# Patient Record
Sex: Male | Born: 1960 | Race: Black or African American | Hispanic: No | Smoking: Never smoker
Health system: Southern US, Community
[De-identification: ages and names within clinical notes are randomized; demographics above are authoritative.]

## PROBLEM LIST (undated history)

## (undated) DIAGNOSIS — J449 Chronic obstructive pulmonary disease, unspecified: Secondary | ICD-10-CM

## (undated) DIAGNOSIS — A419 Sepsis, unspecified organism: Secondary | ICD-10-CM

## (undated) DIAGNOSIS — J4489 Other specified chronic obstructive pulmonary disease: Secondary | ICD-10-CM

## (undated) DIAGNOSIS — I10 Essential (primary) hypertension: Secondary | ICD-10-CM

## (undated) DIAGNOSIS — J9621 Acute and chronic respiratory failure with hypoxia: Secondary | ICD-10-CM

## (undated) DIAGNOSIS — G4733 Obstructive sleep apnea (adult) (pediatric): Secondary | ICD-10-CM

## (undated) DIAGNOSIS — R6520 Severe sepsis without septic shock: Secondary | ICD-10-CM

## (undated) DIAGNOSIS — J869 Pyothorax without fistula: Secondary | ICD-10-CM

## (undated) DIAGNOSIS — J45909 Unspecified asthma, uncomplicated: Secondary | ICD-10-CM

## (undated) HISTORY — PX: HIP SURGERY: SHX245

## (undated) HISTORY — PX: OTHER SURGICAL HISTORY: SHX169

## (undated) HISTORY — PX: KNEE SURGERY: SHX244

---

## 2016-04-18 ENCOUNTER — Emergency Department (HOSPITAL_COMMUNITY)
Admission: EM | Admit: 2016-04-18 | Discharge: 2016-04-18 | Disposition: A | Discharge status: Home / Self Care | Payer: BLUE CROSS/BLUE SHIELD | Attending: Emergency Medicine | Admitting: Emergency Medicine

## 2016-04-18 ENCOUNTER — Encounter (HOSPITAL_COMMUNITY): Payer: Self-pay | Admitting: Neurology

## 2016-04-18 DIAGNOSIS — Y999 Unspecified external cause status: Secondary | ICD-10-CM | POA: Diagnosis not present

## 2016-04-18 DIAGNOSIS — X58XXXA Exposure to other specified factors, initial encounter: Secondary | ICD-10-CM | POA: Diagnosis not present

## 2016-04-18 DIAGNOSIS — Y929 Unspecified place or not applicable: Secondary | ICD-10-CM | POA: Insufficient documentation

## 2016-04-18 DIAGNOSIS — Y939 Activity, unspecified: Secondary | ICD-10-CM | POA: Diagnosis not present

## 2016-04-18 DIAGNOSIS — S81812A Laceration without foreign body, left lower leg, initial encounter: Secondary | ICD-10-CM | POA: Insufficient documentation

## 2016-04-18 DIAGNOSIS — J45909 Unspecified asthma, uncomplicated: Secondary | ICD-10-CM | POA: Insufficient documentation

## 2016-04-18 HISTORY — DX: Unspecified asthma, uncomplicated: J45.909

## 2016-04-18 MED ORDER — LIDOCAINE-EPINEPHRINE (PF) 2 %-1:200000 IJ SOLN
10.0000 mL | Freq: Once | INTRAMUSCULAR | Status: AC
  Administered 2016-04-18: 10 mL
  Filled 2016-04-18: qty 20

## 2016-04-18 NOTE — ED Triage Notes (Signed)
Pt reports was sitting on side of bed applying lotion when he noticed his left lower leg was bleeding and "squirting". He applied pressure and tied a sock around it. Untied sock and wound was squirting blood. Pressure applied and wrapped. Pt is a x 4. Does not remember hitting it on anything.

## 2016-04-18 NOTE — ED Provider Notes (Signed)
[facility]-EMERGENCY DEPT Provider Note   CSN: [id number] Arrival date & time: 04/18/16  1444     History   Chief Complaint Chief Complaint  Patient presents with  . Leg Injury    HPI Michael Proctor is a 55 y.o. male.  Pt presents to the ED with a bleeding area to his left lower leg.  The pt said that he was applying lotion to his leg, then noticed a spot bleeding.  It squirted blood and he applied a sock to his leg.  The wound was squirting in triage and the triage nurse applied a pressure dressing.      Past Medical History:  Diagnosis Date  . Asthma     There are no active problems to display for this patient.   Past Surgical History:  Procedure Laterality Date  . HIP SURGERY         Home Medications    Prior to Admission medications   Not on File    Family History No family history on file.  Social History Social History  Substance Use Topics  . Smoking status: Never Smoker  . Smokeless tobacco: Never Used  . Alcohol use Yes     Allergies   Shellfish allergy   Review of Systems Review of Systems  Skin: Positive for wound.  All other systems reviewed and are negative.    Physical Exam Updated Vital Signs BP 147/90 (BP Location: Left Arm)   Pulse 89   Temp 98.4 F (36.9 C) (Oral)   Resp 16   SpO2 (!) 89%   Physical Exam  Constitutional: He is oriented to person, place, and time. He appears well-developed and well-nourished.  HENT:  Head: Normocephalic and atraumatic.  Right Ear: External ear normal.  Left Ear: External ear normal.  Nose: Nose normal.  Mouth/Throat: Oropharynx is clear and moist.  Eyes: Conjunctivae and EOM are normal. Pupils are equal, round, and reactive to light.  Neck: Normal range of motion. Neck supple.  Cardiovascular: Normal rate, regular rhythm, normal heart sounds and intact distal pulses.   Pulmonary/Chest: Effort normal and breath sounds normal.  Abdominal: Soft. Bowel sounds are normal.    Musculoskeletal: Normal range of motion.  Neurological: He is alert and oriented to person, place, and time.  Skin:     Nursing note and vitals reviewed.    ED Treatments / Results  Labs (all labs ordered are listed, but only abnormal results are displayed) Labs Reviewed - No data to display  EKG  EKG Interpretation None       Radiology No results found.  Procedures .Marland Kitchen.Laceration Repair Date/Time: 04/18/2016 4:45 PM Performed by: Jacalyn LefevreHAVILAND, Haziel Molner Authorized by: Jacalyn LefevreHAVILAND, Jakory Matsuo   Consent:    Consent obtained:  Verbal   Consent given by:  Patient   Risks discussed:  Infection, pain and need for additional repair   Alternatives discussed:  No treatment Anesthesia (see MAR for exact dosages):    Anesthesia method:  Local infiltration   Local anesthetic:  Lidocaine 2% WITH epi Laceration details:    Location:  Leg   Leg location:  L lower leg   Length (cm):  0.1   Depth (mm):  0.1 Pre-procedure details:    Preparation:  Patient was prepped and draped in usual sterile fashion Exploration:    Hemostasis achieved with:  Epinephrine Treatment:    Area cleansed with:  Betadine   Amount of cleaning:  Standard   Irrigation solution:  Sterile saline Skin repair:    Repair  method:  Sutures   Suture size:  4-0   Suture material:  Nylon   Number of sutures:  5 Approximation:    Approximation:  Close Post-procedure details:    Dressing:  Antibiotic ointment and non-adherent dressing   Patient tolerance of procedure:  Tolerated well, no immediate complications Comments:     Stitches were placed on top of each other to try to staunch the bleeding.   (including critical care time)  Medications Ordered in ED Medications  lidocaine-EPINEPHrine (XYLOCAINE W/EPI) 2 %-1:200000 (PF) injection 10 mL (not administered)     Initial Impression / Assessment and Plan / ED Course  I have reviewed the triage vital signs and the nursing notes.  Pertinent labs & imaging results  that were available during my care of the patient were reviewed by me and considered in my medical decision making (see chart for details).  Clinical Course     Pt given instructions on wound care and instructed to return in 1 week for suture removal.  Final Clinical Impressions(s) / ED Diagnoses   Final diagnoses:  Laceration of left lower leg, initial encounter    New Prescriptions New Prescriptions   No medications on file     Jacalyn LefevreJulie Trishia Cuthrell, MD 04/18/16 1646

## 2016-04-25 ENCOUNTER — Emergency Department (HOSPITAL_COMMUNITY): Payer: BLUE CROSS/BLUE SHIELD

## 2016-04-25 ENCOUNTER — Encounter (HOSPITAL_COMMUNITY): Payer: Self-pay

## 2016-04-25 ENCOUNTER — Emergency Department (HOSPITAL_COMMUNITY)
Admission: EM | Admit: 2016-04-25 | Discharge: 2016-04-25 | Disposition: A | Discharge status: Home / Self Care | Payer: BLUE CROSS/BLUE SHIELD | Attending: Emergency Medicine | Admitting: Emergency Medicine

## 2016-04-25 DIAGNOSIS — Z4802 Encounter for removal of sutures: Secondary | ICD-10-CM | POA: Diagnosis not present

## 2016-04-25 DIAGNOSIS — R062 Wheezing: Secondary | ICD-10-CM | POA: Diagnosis present

## 2016-04-25 DIAGNOSIS — J45909 Unspecified asthma, uncomplicated: Secondary | ICD-10-CM | POA: Diagnosis not present

## 2016-04-25 MED ORDER — IPRATROPIUM BROMIDE 0.02 % IN SOLN
0.5000 mg | Freq: Once | RESPIRATORY_TRACT | Status: AC
  Administered 2016-04-25: 0.5 mg via RESPIRATORY_TRACT
  Filled 2016-04-25: qty 2.5

## 2016-04-25 MED ORDER — ALBUTEROL SULFATE (2.5 MG/3ML) 0.083% IN NEBU
5.0000 mg | INHALATION_SOLUTION | Freq: Once | RESPIRATORY_TRACT | Status: AC
  Administered 2016-04-25: 5 mg via RESPIRATORY_TRACT
  Filled 2016-04-25: qty 6

## 2016-04-25 NOTE — ED Provider Notes (Signed)
[facility]-EMER[facility]Y DEPT Provider Note   CSN: [id number] Arrival date & time: 04/25/16  [id number]  By signing my name below, I, Michael Proctor, attest that this documentation has been prepared under the direction and in the presence of [name], MD. Electronically Signed: Rosario AdieWilliam Andrew Proctor, ED Scribe. 04/25/16. 12:50 PM.  History   Chief Complaint Chief Complaint  Patient presents with  . Suture / Staple Removal  . Wheezing   The history is provided by the patient. No language interpreter was used.    HPI Comments: Michael Proctor is a 55 y.o. male who presents to the Emergency Department for a suture removal which was performed ~1 week ago. Pt had a wound to his left lower extremity repaired on 04/18/16 (~7 days ago) following noticing the area bleeding after getting out of the shower. At that time, his wound was repaired with 5 5-0 Nylon sutures well and w/o immediate complication. No h/o DM. He denies any active/uncontrolled bleeding, color change, or otherwise surrounding pain/rash to the area.   Pt is secondarily c/o persistent, gradually worsening wheezing over the past several days as well. Pt has a h/o asthma, and states that his current episode of wheezing feels similar to his current flare-ups. Pt has Albuterol treatments prescribed to him at home for this, which have been minimally controlling this issue. He denies shortness of breath otherwise.   Past Medical History:  Diagnosis Date  . Asthma    There are no active problems to display for this patient.  Past Surgical History:  Procedure Laterality Date  . HIP SURGERY    . KNEE SURGERY    . Tracheotomy       Home Medications    Prior to Admission medications   Not on File   Family History No family history on file.  Social History Social History  Substance Use Topics  . Smoking status: Never Smoker  . Smokeless tobacco: Never Used  . Alcohol use Yes   Allergies   Shellfish allergy  Review of  Systems Review of Systems  Respiratory: Positive for wheezing. Negative for shortness of breath.   Skin: Positive for wound. Negative for color change and rash.  All other systems reviewed and are negative.  Physical Exam Updated Vital Signs BP 144/79 (BP Location: Right Arm)   Pulse 102   Temp 98.3 F (36.8 C) (Oral)   Resp 17   Ht 5\' 7"  (1.702 m)   Wt 275 lb (124.7 kg)   SpO2 92%   BMI 43.07 kg/m   Physical Exam  Constitutional: He is oriented to person, place, and time. He appears well-developed and well-nourished. No distress.  Obese male.   HENT:  Head: Normocephalic and atraumatic.  Eyes: Conjunctivae are normal.  Neck: Normal range of motion.  Cardiovascular: Normal rate.   Pulmonary/Chest: Effort normal. He has wheezes.  Expiratory wheezes in all lung fields.   Abdominal: He exhibits no distension.  Musculoskeletal: Normal range of motion. He exhibits edema.  1+ edema in BLE. Well healed small wound to the medial portion of the left lower leg without evidence of drainage or cellulitis.   Neurological: He is alert and oriented to person, place, and time.  Skin: No pallor.  Psychiatric: He has a normal mood and affect. His behavior is normal.  Nursing note and vitals reviewed.  ED Treatments / Results  DIAGNOSTIC STUDIES: Oxygen Saturation is 92% on RA, low by my interpretation.   COORDINATION OF CARE: 12:25 PM-Discussed next steps with  pt. Pt verbalized understanding and is agreeable with the plan.   Labs (all labs ordered are listed, but only abnormal results are displayed) Labs Reviewed - No data to display  EKG  EKG Interpretation None      Radiology Dg Chest 2 View  Result Date: 04/25/2016 CLINICAL DATA:  Shortness of breath, chest pain for 2 days, history of asthma EXAM: CHEST  2 VIEW COMPARISON:  None. FINDINGS: No active infiltrate or effusion is seen. Mediastinal and hilar contours are unremarkable. The heart is within upper limits of normal.  There are degenerative changes in the shoulders and throughout the thoracic spine. IMPRESSION: No active cardiopulmonary disease. Electronically Signed   By: [name]  Barry M.D.   On: 04/25/2016 11:12   Procedures Procedures   SUTURE REMOVAL Performed by: Gwyneth SproutWhitney Triana Coover, MD Authorized by: Gwyneth SproutWhitney Tamiya Colello, MD Consent: Verbal consent obtained. Consent given by: patient Required items: required blood products, implants, devices, and special equipment available  Time out: Immediately prior to procedure a "time out" was called to verify the correct patient, procedure, equipment, support staff and site/side marked as required. Location: LLE Wound Appearance: clean, dry, intact  Sutures Removed: 5 Post-removal: pressure dressing applied Patient tolerance: Patient tolerated the procedure well with no immediate complications.  Medications Ordered in ED Medications  albuterol (PROVENTIL) (2.5 MG/3ML) 0.083% nebulizer solution 5 mg (5 mg Nebulization Given 04/25/16 1240)  ipratropium (ATROVENT) nebulizer solution 0.5 mg (0.5 mg Nebulization Given 04/25/16 1240)    Initial Impression / Assessment and Plan / ED Course  I have reviewed the triage vital signs and the nursing notes.  Pertinent labs & imaging results that were available during my care of the patient were reviewed by me and considered in my medical decision making (see chart for details).  Clinical Course    Patient presenting today for suture removal. He had sutures placed to a bleeding varicose vein. He has had no further bleeding and has no other complaints at this time however when he arrived in triage he was noted to be wheezing and had an oxygen saturation in the low 90s. He states he has a history of asthma and has been using his inhaler slightly more lately since the weather has changed but denies any new cough, fever or shortness of breath. He is wheezing diffusely on exam but appears comfortable. Patient was given one  nebulizer treatment with improvement in symptoms. Sutures were removed with some recurrent bleeding which stopped quickly when pressure was applied.  Final Clinical Impressions(s) / ED Diagnoses   Final diagnoses:  Visit for suture removal  Moderate asthma, unspecified whether complicated, unspecified whether persistent   New Prescriptions New Prescriptions   No medications on file   I personally performed the services described in this documentation, which was scribed in my presence.  The recorded information has been reviewed and considered.     Gwyneth SproutWhitney Rejoice Heatwole, MD 04/25/16 [phone]

## 2016-04-25 NOTE — ED Triage Notes (Signed)
Per Pt, Pt had to have stitches placed on left lower leg after bleeding noted after a shower. Pt was unaware of what had happened. Pt denies diabetes, but reports thin skin. Denies pain.

## 2016-04-25 NOTE — Discharge Instructions (Signed)
Leave the bandage on until tomorrow and then you can take off.  For the next few days wear a bandaid over the area so it doesn't break back open

## 2016-06-22 ENCOUNTER — Encounter (HOSPITAL_COMMUNITY): Payer: Self-pay | Admitting: Emergency Medicine

## 2016-06-22 ENCOUNTER — Emergency Department (HOSPITAL_COMMUNITY)
Admission: EM | Admit: 2016-06-22 | Discharge: 2016-06-22 | Disposition: A | Discharge status: Home / Self Care | Payer: BLUE CROSS/BLUE SHIELD | Attending: Emergency Medicine | Admitting: Emergency Medicine

## 2016-06-22 DIAGNOSIS — I10 Essential (primary) hypertension: Secondary | ICD-10-CM | POA: Diagnosis not present

## 2016-06-22 DIAGNOSIS — J45909 Unspecified asthma, uncomplicated: Secondary | ICD-10-CM | POA: Diagnosis not present

## 2016-06-22 DIAGNOSIS — T7809XA Anaphylactic reaction due to other food products, initial encounter: Secondary | ICD-10-CM | POA: Insufficient documentation

## 2016-06-22 DIAGNOSIS — T782XXA Anaphylactic shock, unspecified, initial encounter: Secondary | ICD-10-CM

## 2016-06-22 DIAGNOSIS — T7840XA Allergy, unspecified, initial encounter: Secondary | ICD-10-CM | POA: Diagnosis present

## 2016-06-22 DIAGNOSIS — Z79899 Other long term (current) drug therapy: Secondary | ICD-10-CM | POA: Insufficient documentation

## 2016-06-22 HISTORY — DX: Essential (primary) hypertension: I10

## 2016-06-22 MED ORDER — IPRATROPIUM-ALBUTEROL 0.5-2.5 (3) MG/3ML IN SOLN
3.0000 mL | RESPIRATORY_TRACT | Status: AC
  Administered 2016-06-22 (×3): 3 mL via RESPIRATORY_TRACT
  Filled 2016-06-22 (×2): qty 3

## 2016-06-22 MED ORDER — EPINEPHRINE 0.3 MG/0.3ML IJ SOAJ: 0.3000 mg | INTRAMUSCULAR | 0 refills | Status: AC | PRN

## 2016-06-22 MED ORDER — PREDNISONE 20 MG PO TABS: ORAL_TABLET | ORAL | 0 refills | Status: AC

## 2016-06-22 MED ORDER — FAMOTIDINE IN NACL 20-0.9 MG/50ML-% IV SOLN
20.0000 mg | Freq: Once | INTRAVENOUS | Status: AC
  Administered 2016-06-22: 20 mg via INTRAVENOUS
  Filled 2016-06-22: qty 50

## 2016-06-22 NOTE — ED Provider Notes (Signed)
[facility]-EMERGENCY DEPT Provider Note   CSN: [id number] Arrival date & time: 06/22/16  1650     History   Chief Complaint Chief Complaint  Patient presents with  . Allergic Reaction    HPI Michael Proctor is a 56 y.o. male.  56 yo M with a chief complaint of an allergic reaction. Patient stated that he had oysters and trimmed for lunch and then had sudden shortness of breath wheezing and felt like his throat was closing on him. EMS gave him epinephrine and Solu-Medrol Benadryl and DuoNeb. Patient had some improvement from there. States that he feels fine at this time. 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 Denies nausea or vomiting denies feeling like he'll pass out.   The history is provided by the patient and the EMS personnel.  Allergic Reaction  Presenting symptoms: difficulty breathing, difficulty swallowing, itching and wheezing   Presenting symptoms: no rash   Severity:  Severe Duration:  1 hour Prior allergic episodes:  No prior episodes Context: food   Relieved by:  Antihistamines, epinephrine and steroids Worsened by:  Nothing Ineffective treatments:  None tried   Past Medical History:  Diagnosis Date  . Asthma   . Hypertension     There are no active problems to display for this patient.   Past Surgical History:  Procedure Laterality Date  . HIP SURGERY    . KNEE SURGERY    . Tracheotomy          Home Medications    Prior to Admission medications   Medication Sig Start Date End Date Taking? Authorizing Provider  EPINEPHrine 0.3 mg/0.3 mL IJ SOAJ injection Inject 0.3 mLs (0.3 mg total) into the muscle as needed. 06/22/16   Melene Planan Maleeka Sabatino, DO  predniSONE (DELTASONE) 20 MG tablet 2 tabs po daily x 4 days 06/22/16   Melene Planan Shareeka Yim, DO    Family History History reviewed. No pertinent family history.  Social History Social History  Substance Use Topics  . Smoking status: Never Smoker  . Smokeless tobacco: Never Used  .  Alcohol use Yes     Comment: daily beer/wine     Allergies   Shellfish allergy   Review of Systems Review of Systems  Constitutional: Negative for chills and fever.  HENT: Positive for trouble swallowing. Negative for congestion and facial swelling.   Eyes: Negative for discharge and visual disturbance.  Respiratory: Positive for shortness of breath and wheezing.   Cardiovascular: Negative for chest pain and palpitations.  Gastrointestinal: Positive for nausea. Negative for abdominal pain, diarrhea and vomiting.  Musculoskeletal: Negative for arthralgias and myalgias.  Skin: Positive for itching. Negative for color change and rash.  Neurological: Negative for tremors, syncope and headaches.  Psychiatric/Behavioral: Negative for confusion and dysphoric mood.     Physical Exam Updated Vital Signs BP 154/87 (BP Location: Right Arm)   Pulse 104   Temp 98.7 F (37.1 C) (Oral)   Resp (!) 36   Ht 5\' 7"  (1.702 m)   Wt 240 lb (108.9 kg)   SpO2 96%   BMI 37.59 kg/m   Physical Exam  Constitutional: He is oriented to person, place, and time. He appears well-developed and well-nourished.  HENT:  Head: Normocephalic and atraumatic.  Eyes: EOM are normal. Pupils are equal, round, and reactive to light.  Neck: Normal range of motion. Neck supple. No JVD present.  Cardiovascular: Normal rate and regular rhythm.  Exam reveals no gallop and no friction rub.  No murmur heard. Pulmonary/Chest: No respiratory distress. He has wheezes.  Tachypnea   Abdominal: He exhibits no distension and no mass. There is no tenderness. There is no rebound and no guarding.  Musculoskeletal: Normal range of motion.  Neurological: He is alert and oriented to person, place, and time.  Skin: No rash noted. No pallor.  Psychiatric: He has a normal mood and affect. His behavior is normal.  Nursing note and vitals reviewed.    ED Treatments / Results  Labs (all labs ordered are listed, but only  abnormal results are displayed) Labs Reviewed - No data to display  EKG  EKG Interpretation None       Radiology No results found.  Procedures Procedures (including critical care time)  Medications Ordered in ED Medications  ipratropium-albuterol (DUONEB) 0.5-2.5 (3) MG/3ML nebulizer solution 3 mL (3 mLs Nebulization Given 06/22/16 1752)  famotidine (PEPCID) IVPB 20 mg premix (0 mg Intravenous Stopped 06/22/16 1925)     Initial Impression / Assessment and Plan / ED Course  I have reviewed the triage vital signs and the nursing notes.  Pertinent labs & imaging results that were available during my care of the patient were reviewed by me and considered in my medical decision making (see chart for details).     56 yo M With a chief complaint of an allergic reaction. Patient looks like he still having some difficulty breathing will give him 3 DuoNeb's back-to-back Pepcid and reassess.  Patient was reassessed multiple times while in the ED. Continuing to have some faint wheezes but looking much better. He is observed in the ED for 4 hours. Patient states that he feels well to go home. Given strict return precautions. Prescription for epinephrine.  CRITICAL CARE Performed by: Rae Roam   Total critical care time: 35 minutes  Critical care time was exclusive of separately billable procedures and treating other patients.  Critical care was necessary to treat or prevent imminent or life-threatening deterioration.  Critical care was time spent personally by me on the following activities: development of treatment plan with patient and/or surrogate as well as nursing, discussions with consultants, evaluation of patient's response to treatment, examination of patient, obtaining history from patient or surrogate, ordering and performing treatments and interventions, ordering and review of laboratory studies, ordering and review of radiographic studies, pulse oximetry and  re-evaluation of patient's condition.  9:01 PM:  I have discussed the diagnosis/risks/treatment options with the patient and believe the pt to be eligible for discharge home to follow-up with PCP. We also discussed returning to the ED immediately if new or worsening sx occur. We discussed the sx which are most concerning (e.g., sudden worsening pain, fever, inability to tolerate by mouth) that necessitate immediate return. Medications administered to the patient during their visit and any new prescriptions provided to the patient are listed below.  Medications given during this visit Medications  ipratropium-albuterol (DUONEB) 0.5-2.5 (3) MG/3ML nebulizer solution 3 mL (3 mLs Nebulization Given 06/22/16 1752)  famotidine (PEPCID) IVPB 20 mg premix (0 mg Intravenous Stopped 06/22/16 1925)     The patient appears reasonably screen and/or stabilized for discharge and I doubt any other medical condition or other [facility] requiring further screening, evaluation, or treatment in the ED at this time prior to discharge.   Final Clinical Impressions(s) / ED Diagnoses   Final diagnoses:  Anaphylaxis, initial encounter    New Prescriptions New Prescriptions   EPINEPHRINE 0.3 MG/0.3 ML IJ SOAJ INJECTION    Inject  0.3 mLs (0.3 mg total) into the muscle as needed.   PREDNISONE (DELTASONE) 20 MG TABLET    2 tabs po daily x 4 days     Melene Plan, DO 06/22/16 2101

## 2016-06-22 NOTE — ED Triage Notes (Signed)
Per EMS pt was at a [employer] and began feeling like his throat was closing/ allergic reaction. Known seafood allergy. Fire Dept administered 0.3 of EPI IM and albuterol nebulizer. Pt diaphoretic upon EMS arrival.  Pt AOx4. EMS administered Duoneb, 50 benadryl and 125mg  solumedrol. Pt reports he "feels better" wheezing noted bilaterally.

## 2016-06-22 NOTE — ED Notes (Signed)
Call Linette: [phone] to update.

## 2016-06-22 NOTE — Discharge Instructions (Signed)
Take benadryl 50 mg 3 times a day for the next two days.  Return for sudden worsening symptoms.

## 2016-06-22 NOTE — ED Notes (Signed)
Called respiratory-states they will administer breathing treatment.

## 2016-06-22 NOTE — ED Notes (Signed)
Bed: [id number] Expected date:  Expected time:  Means of arrival: Ambulance Comments: EMS allergic reaction

## 2017-04-05 ENCOUNTER — Emergency Department (HOSPITAL_COMMUNITY)
Admission: EM | Admit: 2017-04-05 | Discharge: 2017-04-05 | Disposition: A | Discharge status: Home / Self Care | Payer: BLUE CROSS/BLUE SHIELD | Attending: Emergency Medicine | Admitting: Emergency Medicine

## 2017-04-05 ENCOUNTER — Other Ambulatory Visit: Payer: Self-pay

## 2017-04-05 ENCOUNTER — Emergency Department (HOSPITAL_COMMUNITY): Payer: BLUE CROSS/BLUE SHIELD

## 2017-04-05 ENCOUNTER — Emergency Department (HOSPITAL_BASED_OUTPATIENT_CLINIC_OR_DEPARTMENT_OTHER): Payer: BLUE CROSS/BLUE SHIELD

## 2017-04-05 ENCOUNTER — Encounter (HOSPITAL_COMMUNITY): Payer: Self-pay | Admitting: *Deleted

## 2017-04-05 DIAGNOSIS — M7652 Patellar tendinitis, left knee: Secondary | ICD-10-CM | POA: Diagnosis not present

## 2017-04-05 DIAGNOSIS — M7989 Other specified soft tissue disorders: Secondary | ICD-10-CM | POA: Diagnosis not present

## 2017-04-05 DIAGNOSIS — M79609 Pain in unspecified limb: Secondary | ICD-10-CM

## 2017-04-05 DIAGNOSIS — I878 Other specified disorders of veins: Secondary | ICD-10-CM

## 2017-04-05 DIAGNOSIS — I10 Essential (primary) hypertension: Secondary | ICD-10-CM | POA: Insufficient documentation

## 2017-04-05 DIAGNOSIS — J45909 Unspecified asthma, uncomplicated: Secondary | ICD-10-CM | POA: Insufficient documentation

## 2017-04-05 DIAGNOSIS — M25562 Pain in left knee: Secondary | ICD-10-CM | POA: Diagnosis present

## 2017-04-05 DIAGNOSIS — R600 Localized edema: Secondary | ICD-10-CM | POA: Diagnosis not present

## 2017-04-05 DIAGNOSIS — R609 Edema, unspecified: Secondary | ICD-10-CM

## 2017-04-05 LAB — BASIC METABOLIC PANEL
ANION GAP: 6 (ref 5–15)
BUN: 9 mg/dL (ref 6–20)
CO2: 33 mmol/L — AB (ref 22–32)
Calcium: 8.8 mg/dL — ABNORMAL LOW (ref 8.9–10.3)
Chloride: 101 mmol/L (ref 101–111)
Creatinine, Ser: 1.21 mg/dL (ref 0.61–1.24)
GFR calc Af Amer: >60 mL/min (ref >60)
GLUCOSE: 104 mg/dL — AB (ref 65–99)
POTASSIUM: 4.3 mmol/L (ref 3.5–5.1)
Sodium: 140 mmol/L (ref 135–145)

## 2017-04-05 LAB — CBC WITH DIFFERENTIAL/PLATELET
BASOS ABS: 0 10*3/uL (ref 0.0–0.1)
Basophils Relative: 0 %
Eosinophils Absolute: 0.2 10*3/uL (ref 0.0–0.7)
Eosinophils Relative: 3 %
HEMATOCRIT: 48.9 % (ref 39.0–52.0)
HEMOGLOBIN: 16.6 g/dL (ref 13.0–17.0)
LYMPHS PCT: 32 %
Lymphs Abs: 2 10*3/uL (ref 0.7–4.0)
MCH: 31.5 pg (ref 26.0–34.0)
MCHC: 33.9 g/dL (ref 30.0–36.0)
MCV: 92.8 fL (ref 78.0–100.0)
MONOS PCT: 11 %
Monocytes Absolute: 0.7 10*3/uL (ref 0.1–1.0)
NEUTROS ABS: 3.3 10*3/uL (ref 1.7–7.7)
NEUTROS PCT: 54 %
Platelets: 200 10*3/uL (ref 150–400)
RBC: 5.27 MIL/uL (ref 4.22–5.81)
RDW: 14.7 % (ref 11.5–15.5)
WBC: 6.1 10*3/uL (ref 4.0–10.5)

## 2017-04-05 MED ORDER — OXYCODONE-ACETAMINOPHEN 5-325 MG PO TABS
1.0000 | ORAL_TABLET | Freq: Once | ORAL | Status: AC
  Administered 2017-04-05: 1 via ORAL
  Filled 2017-04-05: qty 1

## 2017-04-05 MED ORDER — NAPROXEN 500 MG PO TABS: 500.0000 mg | ORAL_TABLET | Freq: Two times a day (BID) | ORAL | 0 refills | Status: AC

## 2017-04-05 MED ORDER — TRAMADOL HCL 50 MG PO TABS: 50.0000 mg | ORAL_TABLET | Freq: Four times a day (QID) | ORAL | 0 refills | Status: AC | PRN

## 2017-04-05 MED ORDER — RIVAROXABAN 15 MG PO TABS
15.0000 mg | ORAL_TABLET | Freq: Once | ORAL | Status: AC
  Administered 2017-04-05: 15 mg via ORAL
  Filled 2017-04-05: qty 1

## 2017-04-05 NOTE — ED Notes (Signed)
Returned from Vascular Lab

## 2017-04-05 NOTE — ED Notes (Signed)
Aware he is waiting on vascular study

## 2017-04-05 NOTE — ED Notes (Signed)
Patient sleeping

## 2017-04-05 NOTE — ED Provider Notes (Signed)
[facility] Provider Note   CSN: [id number] Arrival date & time: 04/05/17  0005     History   Chief Complaint Chief Complaint  Patient presents with  . Knee Pain    HPI Michael Proctor is a 56 y.o. male.  The history is provided by the patient.  2 days ago, he woke up with pain in the anterior aspect of his left knee and swelling of his left lower leg.  He states that his left leg normally swells off and on, but current swelling is somewhat worse.  Pain is worse with ambulating.  He rates pain at 10/10.  He denies any trauma denies any unusual activity.  Nothing seems to make it better.  Of note, he states that his left leg is always more swollen than his right leg.  Past Medical History:  Diagnosis Date  . Asthma   . Hypertension     There are no active problems to display for this patient.   Past Surgical History:  Procedure Laterality Date  . HIP SURGERY    . KNEE SURGERY    . Tracheotomy          Home Medications    Prior to Admission medications   Medication Sig Start Date End Date Taking? Authorizing Provider  EPINEPHrine 0.3 mg/0.3 mL IJ SOAJ injection Inject 0.3 mLs (0.3 mg total) into the muscle as needed. 06/22/16   Melene PlanFloyd, Dan, DO  predniSONE (DELTASONE) 20 MG tablet 2 tabs po daily x 4 days 06/22/16   Melene PlanFloyd, Dan, DO    Family History No family history on file.  Social History Social History   Tobacco Use  . Smoking status: Never Smoker  . Smokeless tobacco: Never Used  Substance Use Topics  . Alcohol use: Yes    Comment: daily beer/wine  . Drug use: No     Allergies   Shellfish allergy   Review of Systems Review of Systems  All other systems reviewed and are negative.    Physical Exam Updated Vital Signs BP (!) 162/103   Pulse 89   Temp 98.8 F (37.1 C) (Oral)   Resp 18   Ht 5\' 7"  (1.702 m)   Wt 131.5 kg (290 lb)   SpO2 99%   BMI 45.42 kg/m   Physical Exam  Nursing note and vitals  reviewed.  [age] year old male, resting comfortably and in no acute distress. Vital signs are significant for hypertension. Oxygen saturation is 99%, which is normal. Head is normocephalic and atraumatic. PERRLA, EOMI. Oropharynx is clear. Neck is nontender and supple without adenopathy or JVD. Back is nontender and there is no CVA tenderness. Lungs are clear without rales, wheezes, or rhonchi. Chest is nontender. Heart has regular rate and rhythm without murmur. Abdomen is soft, flat, nontender without masses or hepatosplenomegaly and peristalsis is normoactive. Extremities: Moderate to severe bilateral venous stasis changes.  Left calf circumference is approximately 4 cm greater than right calf circumference.  There is tenderness over the left patellar tendon without any other tenderness elicited.  There is no knee effusion and there is no instability of the knee.  Pain is elicited with left knee extension against resistance, and with passive left knee flexion.  There is no overlying erythema and there is no warmth. 1+ edema of the right leg, 2+ edema of the left leg. Skin is warm and dry without rash. Neurologic: Mental status is normal, cranial nerves are intact, there are no motor or  sensory deficits.  ED Treatments / Results  Labs (all labs ordered are listed, but only abnormal results are displayed) Labs Reviewed  BASIC METABOLIC PANEL - Abnormal; Notable for the following components:      Result Value   CO2 33 (*)    Glucose, Bld 104 (*)    Calcium 8.8 (*)    All other components within normal limits  CBC WITH DIFFERENTIAL/PLATELET    Radiology Dg Knee Complete 4 Views Left  Result Date: 04/05/2017 CLINICAL DATA:  Left knee pain. EXAM: LEFT KNEE - COMPLETE 4+ VIEW COMPARISON:  None. FINDINGS: No evidence of fracture, dislocation, or joint effusion. Mild 3 compartment osteoarthritic changes with joint space narrowing, small osteophyte formation. Chondrocalcinosis versus meniscal  calcifications in the medial compartment. Soft tissues are unremarkable. 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 Chondrocalcinosis versus meniscal calcifications in the medial compartment. The presence of chondral calcifications is usually associated with CPPD arthropathy. Electronically Signed   By: Ted Mcalpineobrinka  Dimitrova M.D.   On: 04/05/2017 01:32    Procedures Procedures (including critical care time)  Medications Ordered in ED Medications  oxyCODONE-acetaminophen (PERCOCET/ROXICET) 5-325 MG per tablet 1 tablet (1 tablet Oral Given 04/05/17 0408)  Rivaroxaban (XARELTO) tablet 15 mg (15 mg Oral Given 04/05/17 0407)     Initial Impression / Assessment and Plan / ED Course  I have reviewed the triage vital signs and the nursing notes.  Pertinent labs & imaging results that were available during my care of the patient were reviewed by me and considered in my medical decision making (see chart for details).  Left knee pain which clinically, is patellar tendinitis.  However, asymmetric calf swelling is very concerning for possible DVT.  I have explained this to the patient.  He will be kept in the ED for vascular venous ultrasound.  Initial dose of rivaroxaban given in the ED.  Screening labs obtained.  He is given a dose of oxycodone-acetaminophen for pain.  Old records are reviewed, and he has no relevant past visits.  Screening labs are unremarkable.  He had reasonably good pain relief with oxycodone-acetaminophen.  Case is signed out to Dr. Fayrene FearingJames pending venous Doppler results.  Final Clinical Impressions(s) / ED Diagnoses   Final diagnoses:  Patellar tendonitis of left knee  Venous stasis of both lower extremities  Peripheral edema    ED Discharge Orders    None       Dione BoozeGlick, Anahita Cua, MD 04/05/17 [phone]

## 2017-04-05 NOTE — Discharge Instructions (Signed)
Ice to painful area. Ultram for pain Naprosyn, Anti-Inflammatory, am and pm.

## 2017-04-05 NOTE — ED Notes (Signed)
Patient resting in bed no distress at this time

## 2017-04-05 NOTE — ED Notes (Signed)
Transported to Vascular Lab

## 2017-04-05 NOTE — Progress Notes (Signed)
*  Preliminary Results* Left lower extremity venous duplex completed. Left lower extremity is negative for deep vein thrombosis. There is no evidence of left Baker's cyst.  Incidental finding: there is a heterogenous area of the left groin, suggestive of prominent inguinal lymph node.  04/05/2017 8:59 AM  Gertie FeyMichelle Sakai Heinle, BS, RVT, RDCS, RDMS

## 2017-04-05 NOTE — ED Triage Notes (Signed)
Pt reports pain in the anterior left knee and pain in the calf area since he woke up two days ago.Pt also has sig swelling to the knee and lower leg. Denies injury. Took goody powder x 2 about 1745 for the pain. Pt has HTN and is out of his meds. Denies cp or sob.

## 2017-09-24 IMAGING — CR DG CHEST 2V
2 series · 2 of 2 positions shown · non-contrast
Comparison: None.

CLINICAL DATA: Shortness of breath, chest pain for 2 days, history
of asthma

EXAM:
CHEST  2 VIEW

[chest pa]
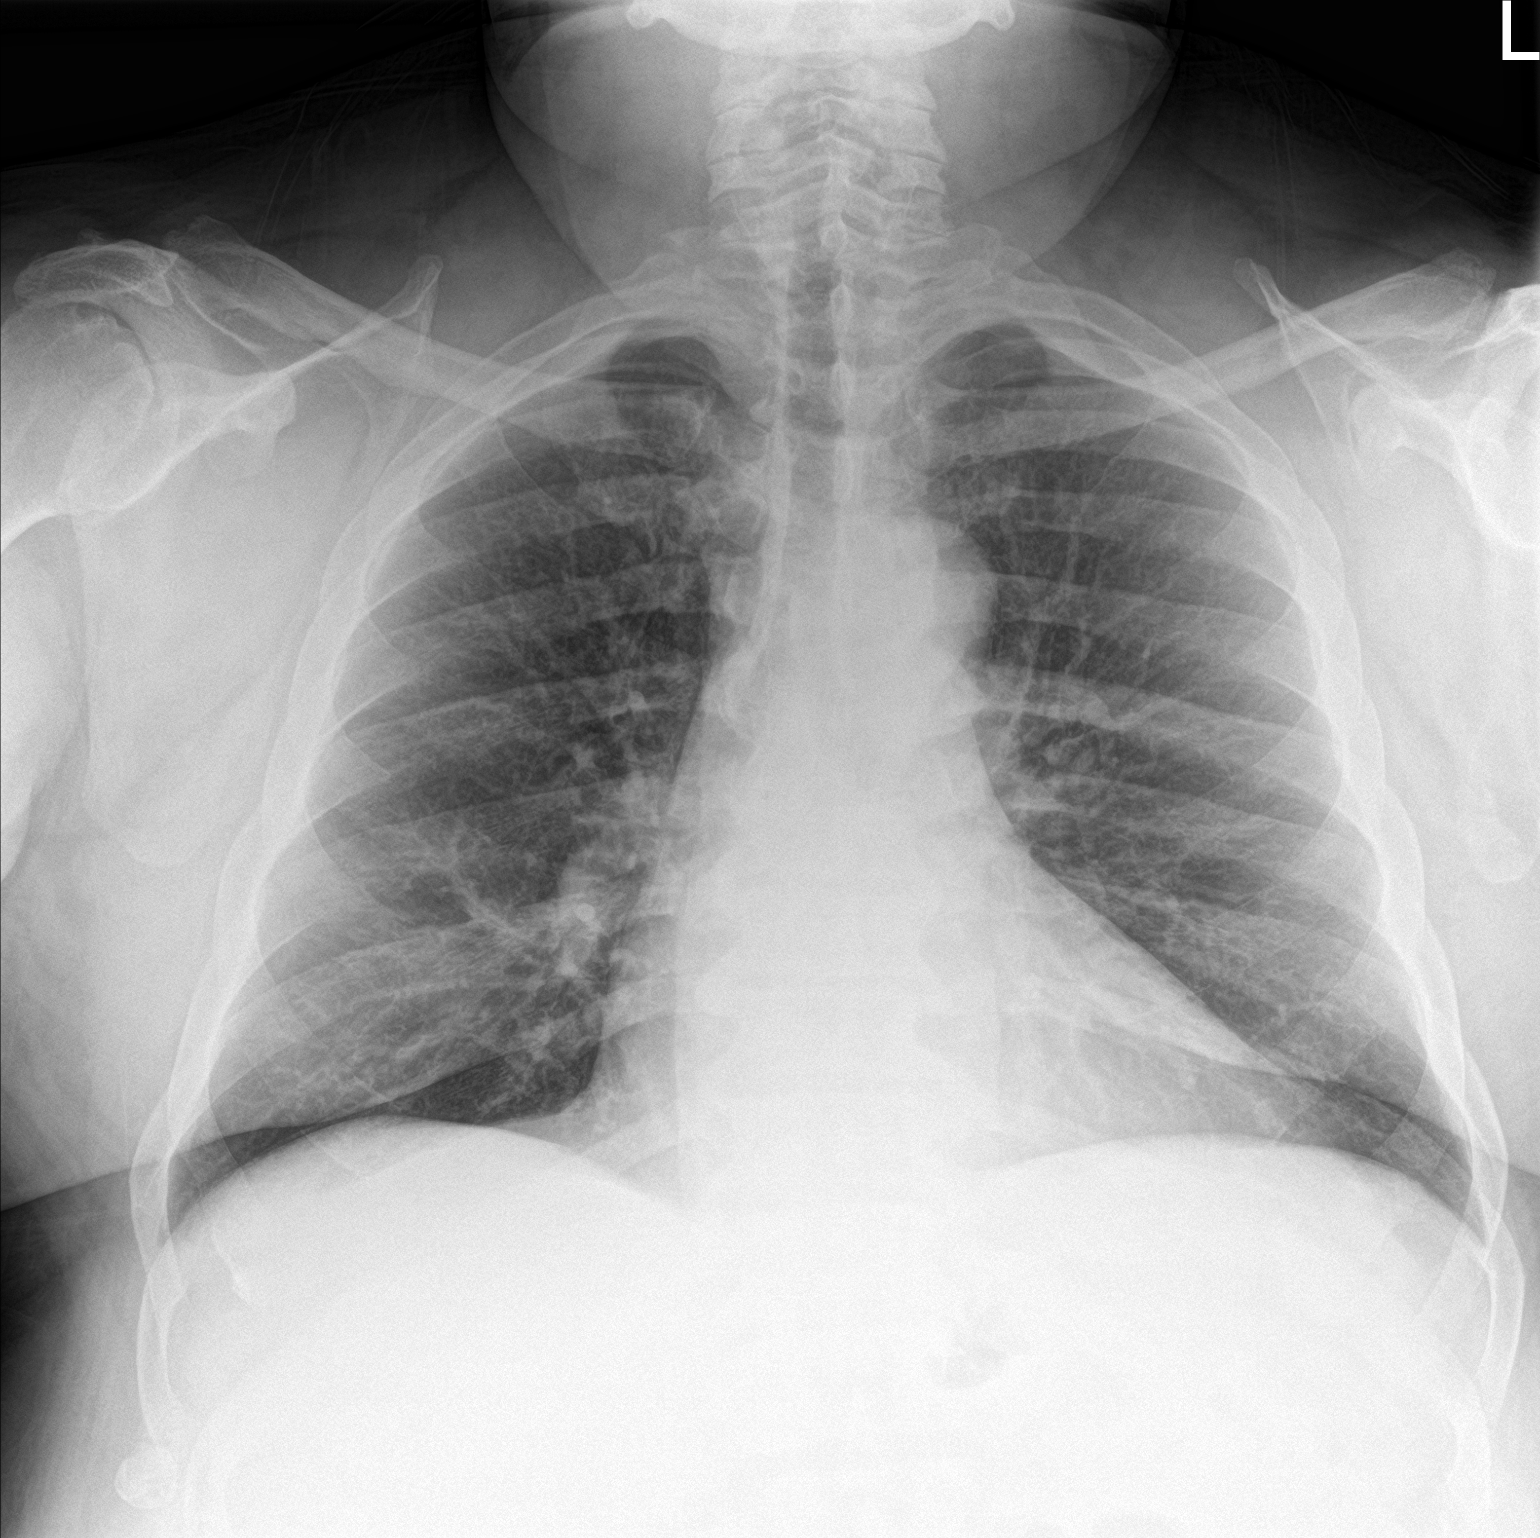

[chest lat]
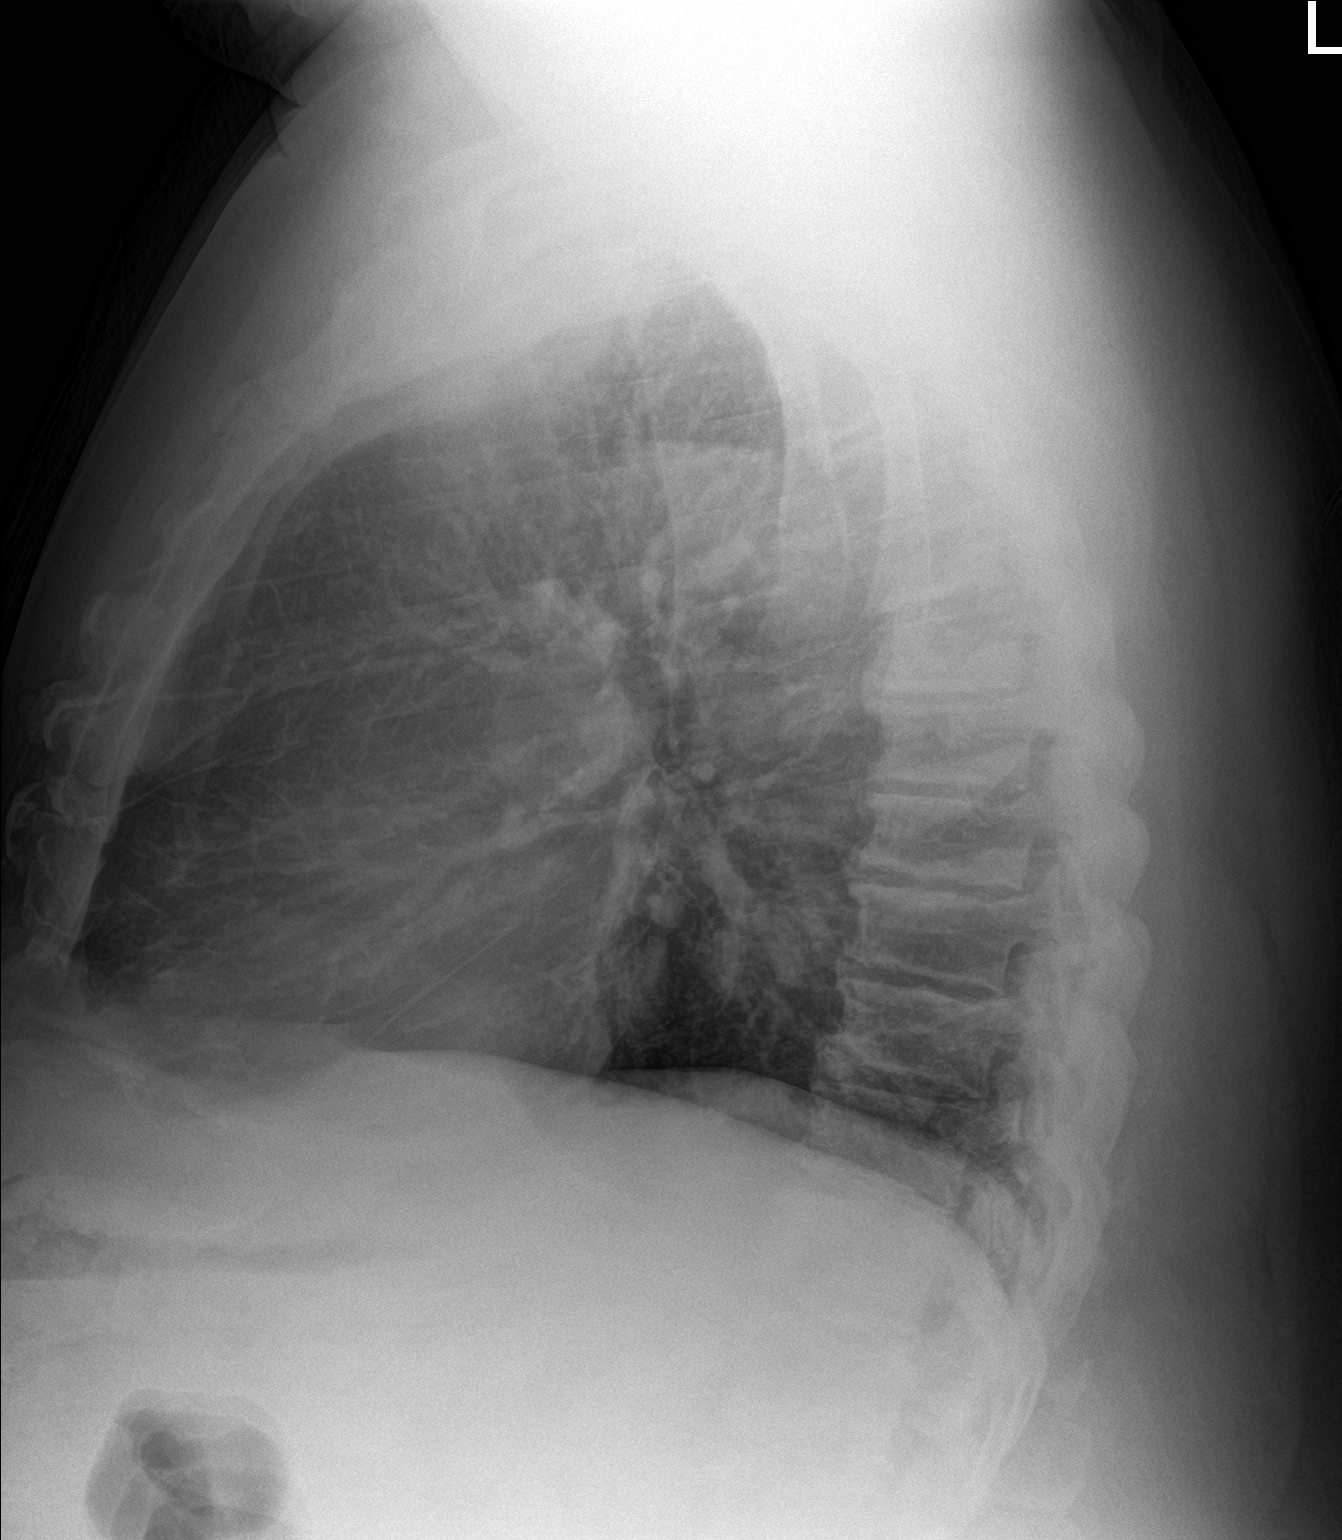

[2 of 2 positions shown; findings below may reference images not displayed]

FINDINGS: No active infiltrate or effusion is seen. Mediastinal and hilar
contours are unremarkable. The heart is within upper limits of
normal. There are degenerative changes in the shoulders and
throughout the thoracic spine.
IMPRESSION: No active cardiopulmonary disease.

## 2020-06-24 ENCOUNTER — Other Ambulatory Visit (HOSPITAL_COMMUNITY): Payer: Self-pay

## 2020-06-24 ENCOUNTER — Inpatient Hospital Stay
Admission: EM | Admit: 2020-06-24 | Discharge: 2020-07-07 | Disposition: A | Discharge status: Home / Self Care | Payer: Medicare HMO | Source: Other Acute Inpatient Hospital | Attending: Internal Medicine | Admitting: Internal Medicine

## 2020-06-24 DIAGNOSIS — J9621 Acute and chronic respiratory failure with hypoxia: Secondary | ICD-10-CM | POA: Diagnosis present

## 2020-06-24 DIAGNOSIS — J9690 Respiratory failure, unspecified, unspecified whether with hypoxia or hypercapnia: Secondary | ICD-10-CM

## 2020-06-24 DIAGNOSIS — G4733 Obstructive sleep apnea (adult) (pediatric): Secondary | ICD-10-CM | POA: Diagnosis present

## 2020-06-24 DIAGNOSIS — J189 Pneumonia, unspecified organism: Secondary | ICD-10-CM

## 2020-06-24 DIAGNOSIS — J869 Pyothorax without fistula: Secondary | ICD-10-CM | POA: Diagnosis present

## 2020-06-24 DIAGNOSIS — A419 Sepsis, unspecified organism: Secondary | ICD-10-CM | POA: Diagnosis present

## 2020-06-24 DIAGNOSIS — J449 Chronic obstructive pulmonary disease, unspecified: Secondary | ICD-10-CM | POA: Diagnosis present

## 2020-06-24 DIAGNOSIS — R6520 Severe sepsis without septic shock: Secondary | ICD-10-CM | POA: Diagnosis present

## 2020-06-24 HISTORY — DX: Pyothorax without fistula: J86.9

## 2020-06-24 HISTORY — DX: Acute and chronic respiratory failure with hypoxia: J96.21

## 2020-06-24 HISTORY — DX: Obstructive sleep apnea (adult) (pediatric): G47.33

## 2020-06-24 HISTORY — DX: Chronic obstructive pulmonary disease, unspecified: J44.9

## 2020-06-24 HISTORY — DX: Severe sepsis without septic shock: R65.20

## 2020-06-24 HISTORY — DX: Other specified chronic obstructive pulmonary disease: J44.89

## 2020-06-24 HISTORY — DX: Sepsis, unspecified organism: A41.9

## 2020-06-24 MED ORDER — IOHEXOL 300 MG/ML  SOLN: 40.0000 mL | Freq: Once | INTRAMUSCULAR | Status: DC | PRN

## 2020-06-25 ENCOUNTER — Other Ambulatory Visit (HOSPITAL_COMMUNITY): Payer: Self-pay

## 2020-06-25 ENCOUNTER — Encounter (HOSPITAL_COMMUNITY): Payer: Self-pay

## 2020-06-25 DIAGNOSIS — J449 Chronic obstructive pulmonary disease, unspecified: Secondary | ICD-10-CM | POA: Diagnosis not present

## 2020-06-25 DIAGNOSIS — J9621 Acute and chronic respiratory failure with hypoxia: Secondary | ICD-10-CM | POA: Diagnosis not present

## 2020-06-25 DIAGNOSIS — J869 Pyothorax without fistula: Secondary | ICD-10-CM | POA: Diagnosis not present

## 2020-06-25 DIAGNOSIS — A419 Sepsis, unspecified organism: Secondary | ICD-10-CM

## 2020-06-25 DIAGNOSIS — R6520 Severe sepsis without septic shock: Secondary | ICD-10-CM

## 2020-06-25 DIAGNOSIS — G4733 Obstructive sleep apnea (adult) (pediatric): Secondary | ICD-10-CM | POA: Diagnosis not present

## 2020-06-25 LAB — CBC WITH DIFFERENTIAL/PLATELET
Abs Immature Granulocytes: 0.02 10*3/uL (ref 0.00–0.07)
Basophils Absolute: 0 10*3/uL (ref 0.0–0.1)
Basophils Relative: 0 %
Eosinophils Absolute: 0.2 10*3/uL (ref 0.0–0.5)
Eosinophils Relative: 3 %
HCT: 25.5 % — ABNORMAL LOW (ref 39.0–52.0)
Hemoglobin: 8.5 g/dL — ABNORMAL LOW (ref 13.0–17.0)
Immature Granulocytes: 0 %
Lymphocytes Relative: 24 %
Lymphs Abs: 1.5 10*3/uL (ref 0.7–4.0)
MCH: 31.3 pg (ref 26.0–34.0)
MCHC: 33.3 g/dL (ref 30.0–36.0)
MCV: 93.8 fL (ref 80.0–100.0)
Monocytes Absolute: 0.7 10*3/uL (ref 0.1–1.0)
Monocytes Relative: 11 %
Neutro Abs: 3.8 10*3/uL (ref 1.7–7.7)
Neutrophils Relative %: 62 %
Platelets: 202 10*3/uL (ref 150–400)
RBC: 2.72 MIL/uL — ABNORMAL LOW (ref 4.22–5.81)
RDW: 15.6 % — ABNORMAL HIGH (ref 11.5–15.5)
WBC: 6.2 10*3/uL (ref 4.0–10.5)
nRBC: 0 % (ref 0.0–0.2)

## 2020-06-25 LAB — COMPREHENSIVE METABOLIC PANEL
ALT: 82 U/L — ABNORMAL HIGH (ref 0–44)
AST: 59 U/L — ABNORMAL HIGH (ref 15–41)
Albumin: 2.4 g/dL — ABNORMAL LOW (ref 3.5–5.0)
Alkaline Phosphatase: 62 U/L (ref 38–126)
Anion gap: 10 (ref 5–15)
BUN: 10 mg/dL (ref 6–20)
CO2: 33 mmol/L — ABNORMAL HIGH (ref 22–32)
Calcium: 8.7 mg/dL — ABNORMAL LOW (ref 8.9–10.3)
Chloride: 93 mmol/L — ABNORMAL LOW (ref 98–111)
Creatinine, Ser: 1.05 mg/dL (ref 0.61–1.24)
GFR, Estimated: >60 mL/min (ref >60)
Glucose, Bld: 116 mg/dL — ABNORMAL HIGH (ref 70–99)
Potassium: 3.4 mmol/L — ABNORMAL LOW (ref 3.5–5.1)
Sodium: 136 mmol/L (ref 135–145)
Total Bilirubin: 0.8 mg/dL (ref 0.3–1.2)
Total Protein: 6.1 g/dL — ABNORMAL LOW (ref 6.5–8.1)

## 2020-06-25 LAB — PHOSPHORUS: Phosphorus: 3.7 mg/dL (ref 2.5–4.6)

## 2020-06-25 LAB — MAGNESIUM: Magnesium: 1.9 mg/dL (ref 1.7–2.4)

## 2020-06-25 LAB — PROTIME-INR
INR: 1.5 — ABNORMAL HIGH (ref 0.8–1.2)
Prothrombin Time: 17.2 seconds — ABNORMAL HIGH (ref 11.4–15.2)

## 2020-06-25 LAB — HEMOGLOBIN A1C
Hgb A1c MFr Bld: 6.8 % — ABNORMAL HIGH (ref 4.8–5.6)
Mean Plasma Glucose: 148.46 mg/dL

## 2020-06-25 NOTE — Consult Note (Signed)
Pulmonary Critical Care Medicine [facility]  PULMONARY SERVICE  Date of Service: 06/25/2020  PULMONARY CRITICAL CARE Michael Proctor  ZOX:[id number]  DOB: 1960/11/28   DOA: 06/24/2020  Referring Physician: Carron Curie, MD  HPI: Michael Proctor is a 60 y.o. male seen for follow up of Acute on Chronic Respiratory Failure.  Patient has multiple medical problems including essential hypertension empyema multifocal pneumonia sepsis asthma sleep apnea pleural effusion pneumothorax came into the hospital because of community-acquired pneumonia.  Apparently had been treated as an outpatient with antibiotics did not really show much in the way of improvement.  On presentation was noted to have a pleural effusion patient had a thoracentesis done which was purulent fluid so therefore had a chest tube placed.  The patient had reaccumulation and also pneumothorax.  Patient's respiratory status deteriorated had to be intubated and placed on mechanical ventilation.  Subsequently patient had a protracted course with ongoing vent support requiring a tracheostomy.  Patient had apparently failed trial of extubation prior to this.  Patient was trached then continued to fail weaning transferred to our facility for further management.  Review of Systems:  ROS performed and is unremarkable other than noted above.  Past Medical History:  Diagnosis Date  . Asthma   . Hypertension     Past Surgical History:  Procedure Laterality Date  . HIP SURGERY    . KNEE SURGERY    . Tracheotomy       Social History:    reports that he has never smoked. He has never used smokeless tobacco. He reports current alcohol use. He reports that he does not use drugs.  Family History: Non-Contributory to the present illness  Allergies  Allergen Reactions  . Shellfish Allergy Swelling    Medications: Reviewed on Rounds  Physical Exam:  Vitals: Temperature is 99.0 pulse 93 respiratory 23  blood pressure 139/54 saturations 97%  Ventilator Settings on T collar with FiO2 35%  . General: Comfortable at this time . Eyes: Grossly normal lids, irises & conjunctiva . ENT: grossly tongue is normal . Neck: no obvious mass . Cardiovascular: S1-S2 normal no gallop or rub . Respiratory: No rhonchi no rales are noted at this time . Abdomen: Soft and nontender . Skin: no rash seen on limited exam . Musculoskeletal: not rigid . Psychiatric:unable to assess . Neurologic: no seizure no involuntary movements         Labs on Admission:  Basic Metabolic Panel: Recent Labs  Lab 06/25/20 0357  NA 136  K 3.4*  CL 93*  CO2 33*  GLUCOSE 116*  BUN 10  CREATININE 1.05  CALCIUM 8.7*  MG 1.9  PHOS 3.7    No results for input(s): PHART, PCO2ART, PO2ART, HCO3, O2SAT in the last 168 hours.  Liver Function Tests: Recent Labs  Lab 06/25/20 0357  AST 59*  ALT 82*  ALKPHOS 62  BILITOT 0.8  PROT 6.1*  ALBUMIN 2.4*   No results for input(s): LIPASE, AMYLASE in the last 168 hours. No results for input(s): AMMONIA in the last 168 hours.  CBC: Recent Labs  Lab 06/25/20 0357  WBC 6.2  NEUTROABS 3.8  HGB 8.5*  HCT 25.5*  MCV 93.8  PLT 202    Cardiac Enzymes: No results for input(s): CKTOTAL, CKMB, CKMBINDEX, TROPONINI in the last 168 hours.  BNP (last 3 results) No results for input(s): BNP in the last 8760 hours.  ProBNP (last 3 results) No results for input(s): PROBNP in the  last 8760 hours.   Radiological Exams on Admission: DG ABDOMEN PEG TUBE LOCATION  Result Date: 06/24/2020 CLINICAL DATA:  Gastrostomy tube placement. EXAM: ABDOMEN - 1 VIEW COMPARISON:  June 05, 2020. FINDINGS: Contrast is injected through gastrostomy tube. Normal filling of contrast into proximal stomach is noted. No extravasation or leakage is noted. No abnormal bowel dilatation is noted. IMPRESSION: Gastrostomy tube appears to be in grossly good position. No extravasation or leakage is  noted. Electronically Signed   By: Lupita Raider M.D.   On: 06/24/2020 18:45   DG Chest Port 1 View  Result Date: 06/25/2020 CLINICAL DATA:  60 year old male with. Respiratory failure history of shortness of breath. EXAM: PORTABLE CHEST 1 VIEW COMPARISON:  Chest x-ray 06/22/2020. FINDINGS: There is a left upper extremity PICC with tip terminating in the proximal superior vena cava. A tracheostomy tube is in place with tip 5.3 cm above the carina. Lung volumes are low. Patchy ill-defined opacities and scattered areas of interstitial prominence are noted throughout the lungs bilaterally, overall significantly improved compared to the prior examination. Possible trace left pleural effusion. No right pleural effusion. No pneumothorax. Appears mildly engorged, without frank pulmonary edema. Heart size is borderline enlarged. The patient is rotated to the left on today's exam, resulting in distortion of the mediastinal contours and reduced diagnostic sensitivity and specificity for mediastinal pathology. IMPRESSION: 1. Support apparatus, as above. 2. Improving aeration in the lungs bilaterally, favored to reflect resolving multilobar bilateral pneumonia. 3. Possible trace left pleural effusion. Electronically Signed   By: Trudie Reed M.D.   On: 06/25/2020 07:43    Assessment/Plan Active Problems:   Acute on chronic respiratory failure with hypoxia (HCC)   Empyema lung (HCC)   Chronic obstructive asthma (HCC)   Obstructive sleep apnea   Severe sepsis (HCC)   1. Acute on chronic respiratory failure with hypoxia patient at this time is weaning on T collar has been requiring 35% FiO2 which will be titrated as tolerated continue with aggressive pulmonary toilet secretion management 2. Empyema status post chest tube drainage along with pneumothorax. 3. Chronic obstructive asthma and COPD patient is at baseline nebulizers will be used as necessary. 4. Obstructive sleep apnea by history when patient is  ready for decannulation will need positive airway pressure. 5. Severe sepsis treated resolved  I have personally seen and evaluated the patient, evaluated laboratory and imaging results, formulated the assessment and plan and placed orders. The Patient requires high complexity decision making with multiple systems involvement.  Case was discussed on Rounds with the Respiratory Therapy Director and the Respiratory staff Time Spent  Yevonne Pax, MD [facility] Pulmonary Critical Care Medicine Sleep Medicine

## 2020-06-25 NOTE — Consult Note (Signed)
Infectious Disease Consultation   Michael Proctor  RCB:[id number]  DOB: 09-26-1960  DOA: 06/24/2020  Requesting physician: Dr. Sharyon Medicus  Reason for consultation: Antibiotic recommendations   History of Present Illness: Michael Proctor is an 60 y.o. male with medical history significant of COPD, asthma, hypertension, obstructive sleep apnea who presented to the emergency room at [facility] on 05/15/2020 for worsening shortness of breath, acute hypoxemic respiratory failure thought to be secondary to community-acquired pneumonia.  He apparently had multiple ED visits for the same complaint since December 2021 and had received treatment with antibiotics.  In the emergency room he was found to be tachycardic with low-grade fever, hypoxemia.  Lab work showed leukocytosis with WBC count 21.5, mildly positive troponin, lactate was negative.  COVID-19 test was negative.  EKG showed tachycardia.  Chest x-ray showed subtotal opacification of the left hemothorax.  He underwent thoracentesis on 05/16/2020 with removal of 1.4 L of purulent fluid.  A large bore chest tube was placed given significant reaccumulation of fluid and pneumothorax.  On 05/17/2020 he had worsening respiratory failure and required intubation.  On 05/19/2020 he had seizures and was given Versed.  On 05/21/2020 he received TPA/dornase via chest tube.  He initially required pressors but later weaned off.  On 06/02/2020 he was extubated on 6 L nasal cannula.  His respiratory cultures grew staph epi, Prevotella.  He became hypotensive again on 06/04/2020 and was started on Levophed again and had worsening respiratory failure and unfortunately had to be intubated again.  On 06/05/2020 the vasopressors were weaned off.  He failed spontaneous breathing trial and failed weaning.  On 06/10/2020 tracheostomy and PEG tube was done.  Infectious disease was consulted for the empyema.  His empyema/abscess cultures apparently showed  Streptococcus constellatus.  Regarding his antibiotics he was initially treated with IV vancomycin, cefepime, Flagyl.  Per infectious disease they recommended to continue treatment with IV ceftriaxone, Flagyl to complete 6 weeks course. He is c/o cough, shortness of breath, arm swelling. On O2.  Denies having any nausea, vomiting, abdominal pain, diarrhea or dysuria at this time.   Review of Systems:  Review of systems negative except as mentioned above in the HPI.   Past Medical History: Past Medical History:  Diagnosis Date  . Asthma   . Hypertension     Past Surgical History: Past Surgical History:  Procedure Laterality Date  . HIP SURGERY    . KNEE SURGERY    . Tracheotomy        Allergies:   Allergies  Allergen Reactions  . Shellfish Allergy Swelling     Social History:  reports that he has never smoked. He has never used smokeless tobacco. He reports current alcohol use. He reports that he does not use drugs.   Family History: Family history of heart disease, stroke, hypertension   Physical Exam: Vitals: Temperature 100.3, pulse 105, respiratory 29, blood pressure 166/82, pulse oximetry 94% Constitutional: Ill-appearing male, obese, awake. Head: Atraumatic, normocephalic Eyes: PERLA, EOMI ENMT: external ears and nose appear normal, normal hearing, Lips appears normal, moist oral mucosa  Neck: Has trach in place CVS: S1-S2 Respiratory: Coarse breath sounds, rhonchi Abdomen: Obese, soft nontender, nondistended, normal bowel sounds Musculoskeletal: Mild edema of the lower extremities, right upper extremity edema Neuro: Debility with generalized weakness otherwise grossly nonfocal Psych: judgement and insight appear normal, stable mood and affect Skin: no rashes  Data reviewed:  I have personally reviewed following  labs and imaging studies Labs:  CBC: Recent Labs  Lab 06/25/20 0357  WBC 6.2  NEUTROABS 3.8  HGB 8.5*  HCT 25.5*  MCV 93.8  PLT 202     Basic Metabolic Panel: Recent Labs  Lab 06/25/20 0357  NA 136  K 3.4*  CL 93*  CO2 33*  GLUCOSE 116*  BUN 10  CREATININE 1.05  CALCIUM 8.7*  MG 1.9  PHOS 3.7   GFR CrCl cannot be calculated (Unknown ideal weight.). Liver Function Tests: Recent Labs  Lab 06/25/20 0357  AST 59*  ALT 82*  ALKPHOS 62  BILITOT 0.8  PROT 6.1*  ALBUMIN 2.4*   No results for input(s): LIPASE, AMYLASE in the last 168 hours. No results for input(s): AMMONIA in the last 168 hours. Coagulation profile Recent Labs  Lab 06/25/20 0357  INR 1.5*    Cardiac Enzymes: No results for input(s): CKTOTAL, CKMB, CKMBINDEX, TROPONINI in the last 168 hours. BNP: Invalid input(s): POCBNP CBG: No results for input(s): GLUCAP in the last 168 hours. D-Dimer No results for input(s): DDIMER in the last 72 hours. Hgb A1c Recent Labs    06/25/20 0357  HGBA1C 6.8*   Lipid Profile No results for input(s): CHOL, HDL, LDLCALC, TRIG, CHOLHDL, LDLDIRECT in the last 72 hours. Thyroid function studies No results for input(s): TSH, T4TOTAL, T3FREE, THYROIDAB in the last 72 hours.  Invalid input(s): FREET3 Anemia work up No results for input(s): VITAMINB12, FOLATE, FERRITIN, TIBC, IRON, RETICCTPCT in the last 72 hours. Urinalysis No results found for: COLORURINE, APPEARANCEUR, LABSPEC, PHURINE, GLUCOSEU, HGBUR, BILIRUBINUR, KETONESUR, PROTEINUR, UROBILINOGEN, NITRITE, LEUKOCYTESUR   Sepsis Labs Invalid input(s): PROCALCITONIN,  WBC,  LACTICIDVEN Microbiology No results found for this or any previous visit (from the past 240 hour(s)).   Inpatient Medications:   Please see MAR  Radiological Exams on Admission: DG ABDOMEN PEG TUBE LOCATION  Result Date: 06/24/2020 CLINICAL DATA:  Gastrostomy tube placement. EXAM: ABDOMEN - 1 VIEW COMPARISON:  June 05, 2020. FINDINGS: Contrast is injected through gastrostomy tube. Normal filling of contrast into proximal stomach is noted. No extravasation or  leakage is noted. No abnormal bowel dilatation is noted. IMPRESSION: Gastrostomy tube appears to be in grossly good position. No extravasation or leakage is noted. Electronically Signed   By: Lupita Raider M.D.   On: 06/24/2020 18:45   DG Chest Port 1 View  Result Date: 06/25/2020 CLINICAL DATA:  60 year old male with. Respiratory failure history of shortness of breath. EXAM: PORTABLE CHEST 1 VIEW COMPARISON:  Chest x-ray 06/22/2020. FINDINGS: There is a left upper extremity PICC with tip terminating in the proximal superior vena cava. A tracheostomy tube is in place with tip 5.3 cm above the carina. Lung volumes are low. Patchy ill-defined opacities and scattered areas of interstitial prominence are noted throughout the lungs bilaterally, overall significantly improved compared to the prior examination. Possible trace left pleural effusion. No right pleural effusion. No pneumothorax. Appears mildly engorged, without frank pulmonary edema. Heart size is borderline enlarged. The patient is rotated to the left on today's exam, resulting in distortion of the mediastinal contours and reduced diagnostic sensitivity and specificity for mediastinal pathology. IMPRESSION: 1. Support apparatus, as above. 2. Improving aeration in the lungs bilaterally, favored to reflect resolving multilobar bilateral pneumonia. 3. Possible trace left pleural effusion. Electronically Signed   By: Trudie Reed M.D.   On: 06/25/2020 07:43    Impression/Recommendations Active Problems: Acute on chronic respiratory failure with hypoxemia Empyema of the left lung Multifocal pneumonia  Sepsis with shock, currently shock resolved Left pleural effusion Bilateral lower extremity DVT Right upper extremity blood clot Obstructive sleep apnea Obesity, class III  Acute on chronic respiratory failure with hypoxemia: Patient has history of COPD presented with worsening respiratory failure likely secondary to the empyema and multifocal  pneumonia. He had drainage of the empyema with cultures that showed Streptococcus constellatus.  Regarding his antibiotics he was initially treated with IV vancomycin, cefepime, Flagyl. Currently on treatment with IV ceftriaxone. I would also recommend to add Flagyl. He will need a total of 6 weeks antibiotic therapy with tentative end date 07/01/2020 pending improvement. If his respiratory status worsens would recommend to repeat chest imaging preferably chest CT which can be done without contrast if concern for renal compromise. -He has multiple visits to the ER at the acute facility for recurrent pneumonia. He may benefit from immunoglobulin studies to further investigate the etiology for his recurrent pneumonias once his acute issues resolve. Suggest outpatient pulmonary follow-up.  Empyema: He had empyema of the left lung and underwent drainage of the empyema with cultures that showed Streptococcus constellatus. He also had respiratory cultures that apparently showed Prevotella. Currently on treatment with IV ceftriaxone. Would recommend to add Flagyl for anaerobic coverage and complete total of 6 weeks of treatment. He previously had a chest tube which has been removed at this time. As mentioned above if his respiratory status worsens suggest to repeat chest imaging preferably chest CT to better evaluate.  Multifocal pneumonia: Antibiotics and plan as mentioned above. Currently on treatment with IV ceftriaxone. Suggest to add Flagyl for anaerobic coverage. If his respiratory status worsens suggest to repeat respiratory cultures and repeat chest imaging as mentioned above.  Sepsis with shock, currently shock resolved: He required pressors at the acute facility. Currently shock is resolved. On antibiotics as mentioned above. However, if he starts having worsening fevers, worsening leukocytosis would recommend to send for repeat pancultures and repeat imaging as mentioned above. Continue to monitor  closely.  Bilateral lower extremity DVT/Right upper extremity blood clot: On Eliquis. Further management per the primary team.  Obstructive sleep apnea: On CPAP. Continue supportive management per the primary team.  Obesity, class III: Continue supportive management per primary team.  Due to his complex medical problems he is high risk for worsening and decompensation. Plan of care discussed with the patient and his wife at the bedside. Also discussed with the primary team.  Thank you for this consultation.    Vonzella Nipple M.D. 06/25/2020, 3:35 PM

## 2020-06-26 DIAGNOSIS — J449 Chronic obstructive pulmonary disease, unspecified: Secondary | ICD-10-CM | POA: Diagnosis not present

## 2020-06-26 DIAGNOSIS — G4733 Obstructive sleep apnea (adult) (pediatric): Secondary | ICD-10-CM | POA: Diagnosis not present

## 2020-06-26 DIAGNOSIS — J9621 Acute and chronic respiratory failure with hypoxia: Secondary | ICD-10-CM | POA: Diagnosis not present

## 2020-06-26 DIAGNOSIS — J869 Pyothorax without fistula: Secondary | ICD-10-CM | POA: Diagnosis not present

## 2020-06-26 LAB — POTASSIUM: Potassium: 3.8 mmol/L (ref 3.5–5.1)

## 2020-06-26 NOTE — Progress Notes (Signed)
Pulmonary Critical Care Medicine [facility] [place]   PULMONARY CRITICAL CARE SERVICE  PROGRESS NOTE  Date of Service: 06/26/2020  Michael Proctor  DUK:[id number]  DOB: 06-09-1960   DOA: 06/24/2020  Referring Physician: Carron Curie, MD  HPI: Michael Proctor is a 60 y.o. male seen for follow up of Acute on Chronic Respiratory Failure.  Patient resting comfortably now without distress at this time  Medications: Reviewed on Rounds  Physical Exam:  Vitals: Temperature is 99.2 pulse 76 respiratory rate is 19 blood pressure 96/53 saturations 99%  Ventilator Settings on T collar with an FiO2 35%  . General: Comfortable at this time . Eyes: Grossly normal lids, irises & conjunctiva . ENT: grossly tongue is normal . Neck: no obvious mass . Cardiovascular: S1 S2 normal no gallop . Respiratory: No rhonchi very coarse percent . Abdomen: soft . Skin: no rash seen on limited exam . Musculoskeletal: not rigid . Psychiatric:unable to assess . Neurologic: no seizure no involuntary movements         Lab Data:   Basic Metabolic Panel: Recent Labs  Lab 06/25/20 0357 06/26/20 0505  NA 136  --   K 3.4* 3.8  CL 93*  --   CO2 33*  --   GLUCOSE 116*  --   BUN 10  --   CREATININE 1.05  --   CALCIUM 8.7*  --   MG 1.9  --   PHOS 3.7  --     ABG: No results for input(s): PHART, PCO2ART, PO2ART, HCO3, O2SAT in the last 168 hours.  Liver Function Tests: Recent Labs  Lab 06/25/20 0357  AST 59*  ALT 82*  ALKPHOS 62  BILITOT 0.8  PROT 6.1*  ALBUMIN 2.4*   No results for input(s): LIPASE, AMYLASE in the last 168 hours. No results for input(s): AMMONIA in the last 168 hours.  CBC: Recent Labs  Lab 06/25/20 0357  WBC 6.2  NEUTROABS 3.8  HGB 8.5*  HCT 25.5*  MCV 93.8  PLT 202    Cardiac Enzymes: No results for input(s): CKTOTAL, CKMB, CKMBINDEX, TROPONINI in the last 168 hours.  BNP (last 3 results) No results for input(s): BNP in the last 8760  hours.  ProBNP (last 3 results) No results for input(s): PROBNP in the last 8760 hours.  Radiological Exams: DG ABDOMEN PEG TUBE LOCATION  Result Date: 06/24/2020 CLINICAL DATA:  Gastrostomy tube placement. EXAM: ABDOMEN - 1 VIEW COMPARISON:  June 05, 2020. FINDINGS: Contrast is injected through gastrostomy tube. Normal filling of contrast into proximal stomach is noted. No extravasation or leakage is noted. No abnormal bowel dilatation is noted. IMPRESSION: Gastrostomy tube appears to be in grossly good position. No extravasation or leakage is noted. Electronically Signed   By: Lupita Raider M.D.   On: 06/24/2020 18:45   DG Chest Port 1 View  Result Date: 06/25/2020 CLINICAL DATA:  60 year old male with. Respiratory failure history of shortness of breath. EXAM: PORTABLE CHEST 1 VIEW COMPARISON:  Chest x-ray 06/22/2020. FINDINGS: There is a left upper extremity PICC with tip terminating in the proximal superior vena cava. A tracheostomy tube is in place with tip 5.3 cm above the carina. Lung volumes are low. Patchy ill-defined opacities and scattered areas of interstitial prominence are noted throughout the lungs bilaterally, overall significantly improved compared to the prior examination. Possible trace left pleural effusion. No right pleural effusion. No pneumothorax. Appears mildly engorged, without frank pulmonary edema. Heart size is borderline enlarged. The patient is rotated to  the left on today's exam, resulting in distortion of the mediastinal contours and reduced diagnostic sensitivity and specificity for mediastinal pathology. IMPRESSION: 1. Support apparatus, as above. 2. Improving aeration in the lungs bilaterally, favored to reflect resolving multilobar bilateral pneumonia. 3. Possible trace left pleural effusion. Electronically Signed   By: Trudie Reed M.D.   On: 06/25/2020 07:43    Assessment/Plan Active Problems:   Acute on chronic respiratory failure with hypoxia (HCC)    Empyema lung (HCC)   Chronic obstructive asthma (HCC)   Obstructive sleep apnea   Severe sepsis (HCC)   1. Acute on chronic respiratory failure with hypoxia we will continue with T collar titrate oxygen down as tolerated. 2. Empyema of the lung status post chest tube drainage 3. Chronic obstructive asthma appears to be better controlled 4. Obstructive sleep apnea nonissue at this time 5. Severe sepsis resolved   I have personally seen and evaluated the patient, evaluated laboratory and imaging results, formulated the assessment and plan and placed orders. The Patient requires high complexity decision making with multiple systems involvement.  Rounds were done with the Respiratory Therapy Director and Staff therapists and discussed with nursing staff also.  Yevonne Pax, MD [facility] Pulmonary Critical Care Medicine Sleep Medicine

## 2020-06-27 DIAGNOSIS — G4733 Obstructive sleep apnea (adult) (pediatric): Secondary | ICD-10-CM | POA: Diagnosis not present

## 2020-06-27 DIAGNOSIS — J9621 Acute and chronic respiratory failure with hypoxia: Secondary | ICD-10-CM | POA: Diagnosis not present

## 2020-06-27 DIAGNOSIS — J869 Pyothorax without fistula: Secondary | ICD-10-CM | POA: Diagnosis not present

## 2020-06-27 DIAGNOSIS — J449 Chronic obstructive pulmonary disease, unspecified: Secondary | ICD-10-CM | POA: Diagnosis not present

## 2020-06-27 LAB — BLOOD GAS, ARTERIAL
Acid-Base Excess: 9.7 mmol/L — ABNORMAL HIGH (ref 0.0–2.0)
Bicarbonate: 34.6 mmol/L — ABNORMAL HIGH (ref 20.0–28.0)
FIO2: 35
O2 Saturation: 96 %
Patient temperature: 36.8
pCO2 arterial: 55 mmHg — ABNORMAL HIGH (ref 32.0–48.0)
pH, Arterial: 7.415 (ref 7.350–7.450)
pO2, Arterial: 85.8 mmHg (ref 83.0–108.0)

## 2020-06-27 NOTE — Progress Notes (Signed)
Pulmonary Critical Care Medicine [facility] [place]   PULMONARY CRITICAL CARE SERVICE  PROGRESS NOTE  Date of Service: 06/27/2020  Michael Proctor  DXA:[id number]  DOB: Jun 02, 1960   DOA: 06/24/2020  Referring Physician: Carron Curie, MD  HPI: Michael Proctor is a 60 y.o. male seen for follow up of Acute on Chronic Respiratory Failure.  Patient currently is capping on 35% FiO2 ready for downsized  Medications: Reviewed on Rounds  Physical Exam:  Vitals: Temperature 98.4 pulse 87 respiratory rate is 29 blood pressure is 125/73 saturations 98%  Ventilator Settings capping off the ventilator at this time  . General: Comfortable at this time . Eyes: Grossly normal lids, irises & conjunctiva . ENT: grossly tongue is normal . Neck: no obvious mass . Cardiovascular: S1 S2 normal no gallop . Respiratory: No rhonchi very coarse breath sounds . Abdomen: soft . Skin: no rash seen on limited exam . Musculoskeletal: not rigid . Psychiatric:unable to assess . Neurologic: no seizure no involuntary movements         Lab Data:   Basic Metabolic Panel: Recent Labs  Lab 06/25/20 0357 06/26/20 0505  NA 136  --   K 3.4* 3.8  CL 93*  --   CO2 33*  --   GLUCOSE 116*  --   BUN 10  --   CREATININE 1.05  --   CALCIUM 8.7*  --   MG 1.9  --   PHOS 3.7  --     ABG: No results for input(s): PHART, PCO2ART, PO2ART, HCO3, O2SAT in the last 168 hours.  Liver Function Tests: Recent Labs  Lab 06/25/20 0357  AST 59*  ALT 82*  ALKPHOS 62  BILITOT 0.8  PROT 6.1*  ALBUMIN 2.4*   No results for input(s): LIPASE, AMYLASE in the last 168 hours. No results for input(s): AMMONIA in the last 168 hours.  CBC: Recent Labs  Lab 06/25/20 0357  WBC 6.2  NEUTROABS 3.8  HGB 8.5*  HCT 25.5*  MCV 93.8  PLT 202    Cardiac Enzymes: No results for input(s): CKTOTAL, CKMB, CKMBINDEX, TROPONINI in the last 168 hours.  BNP (last 3 results) No results for input(s): BNP in the  last 8760 hours.  ProBNP (last 3 results) No results for input(s): PROBNP in the last 8760 hours.  Radiological Exams: No results found.  Assessment/Plan Active Problems:   Acute on chronic respiratory failure with hypoxia (HCC)   Empyema lung (HCC)   Chronic obstructive asthma (HCC)   Obstructive sleep apnea   Severe sepsis (HCC)   1. Acute on chronic respiratory failure with hypoxia we will continue with the weaning process downsize the trach to a cuffless trach 2. Obstructive asthma controlled nebulizers as necessary 3. Empyema of the lung status post chest tube drainage 4. Obstructive sleep apnea no change 5. Severe sepsis treated we will continue to monitor   I have personally seen and evaluated the patient, evaluated laboratory and imaging results, formulated the assessment and plan and placed orders. The Patient requires high complexity decision making with multiple systems involvement.  Rounds were done with the Respiratory Therapy Director and Staff therapists and discussed with nursing staff also.  Yevonne Pax, MD [facility] Pulmonary Critical Care Medicine Sleep Medicine

## 2020-06-28 DIAGNOSIS — G4733 Obstructive sleep apnea (adult) (pediatric): Secondary | ICD-10-CM | POA: Diagnosis not present

## 2020-06-28 DIAGNOSIS — J869 Pyothorax without fistula: Secondary | ICD-10-CM | POA: Diagnosis not present

## 2020-06-28 DIAGNOSIS — J449 Chronic obstructive pulmonary disease, unspecified: Secondary | ICD-10-CM | POA: Diagnosis not present

## 2020-06-28 DIAGNOSIS — J9621 Acute and chronic respiratory failure with hypoxia: Secondary | ICD-10-CM | POA: Diagnosis not present

## 2020-06-28 LAB — BASIC METABOLIC PANEL
Anion gap: 12 (ref 5–15)
BUN: 10 mg/dL (ref 6–20)
CO2: 33 mmol/L — ABNORMAL HIGH (ref 22–32)
Calcium: 9 mg/dL (ref 8.9–10.3)
Chloride: 93 mmol/L — ABNORMAL LOW (ref 98–111)
Creatinine, Ser: 0.81 mg/dL (ref 0.61–1.24)
GFR, Estimated: >60 mL/min (ref >60)
Glucose, Bld: 109 mg/dL — ABNORMAL HIGH (ref 70–99)
Potassium: 4.1 mmol/L (ref 3.5–5.1)
Sodium: 138 mmol/L (ref 135–145)

## 2020-06-28 LAB — CBC
HCT: 28.6 % — ABNORMAL LOW (ref 39.0–52.0)
Hemoglobin: 9.3 g/dL — ABNORMAL LOW (ref 13.0–17.0)
MCH: 31.1 pg (ref 26.0–34.0)
MCHC: 32.5 g/dL (ref 30.0–36.0)
MCV: 95.7 fL (ref 80.0–100.0)
Platelets: 217 10*3/uL (ref 150–400)
RBC: 2.99 MIL/uL — ABNORMAL LOW (ref 4.22–5.81)
RDW: 15.4 % (ref 11.5–15.5)
WBC: 5.8 10*3/uL (ref 4.0–10.5)
nRBC: 0 % (ref 0.0–0.2)

## 2020-06-28 LAB — MAGNESIUM: Magnesium: 2 mg/dL (ref 1.7–2.4)

## 2020-06-28 LAB — PHOSPHORUS: Phosphorus: 4.5 mg/dL (ref 2.5–4.6)

## 2020-06-28 NOTE — Progress Notes (Signed)
Pulmonary Critical Care Medicine [facility] [place]   PULMONARY CRITICAL CARE SERVICE  PROGRESS NOTE  Date of Service: 06/28/2020  Michael Proctor  [id number]  DOB: [birth year]   DOA: 06/24/2020  Referring Physician: Carron Curie, MD  HPI: Michael Proctor is a 60 y.o. male seen for follow up of Acute on Chronic Respiratory Failure.  Patient currently is on T collar has been on 28% FiO2 using PMV  Medications: Reviewed on Rounds  Physical Exam:  Vitals: Temperature 98.8 pulse 90 respiratory 18 blood pressure is 128/75 saturations 100%  Ventilator Settings on T collar with an FiO2 28%  . General: Comfortable at this time . Eyes: Grossly normal lids, irises & conjunctiva . ENT: grossly tongue is normal . Neck: no obvious mass . Cardiovascular: S1 S2 normal no gallop . Respiratory: Scattered rhonchi very coarse breath sounds . Abdomen: soft . Skin: no rash seen on limited exam . Musculoskeletal: not rigid . Psychiatric:unable to assess . Neurologic: no seizure no involuntary movements         Lab Data:   Basic Metabolic Panel: Recent Labs  Lab 06/25/20 0357 06/26/20 0505 06/28/20 0414  NA 136  --  138  K 3.4* 3.8 4.1  CL 93*  --  93*  CO2 33*  --  33*  GLUCOSE 116*  --  109*  BUN 10  --  10  CREATININE 1.05  --  0.81  CALCIUM 8.7*  --  9.0  MG 1.9  --  2.0  PHOS 3.7  --  4.5    ABG: Recent Labs  Lab 06/27/20 1015  PHART 7.415  PCO2ART 55.0*  PO2ART 85.8  HCO3 34.6*  O2SAT 96.0    Liver Function Tests: Recent Labs  Lab 06/25/20 0357  AST 59*  ALT 82*  ALKPHOS 62  BILITOT 0.8  PROT 6.1*  ALBUMIN 2.4*   No results for input(s): LIPASE, AMYLASE in the last 168 hours. No results for input(s): AMMONIA in the last 168 hours.  CBC: Recent Labs  Lab 06/25/20 0357 06/28/20 0414  WBC 6.2 5.8  NEUTROABS 3.8  --   HGB 8.5* 9.3*  HCT 25.5* 28.6*  MCV 93.8 95.7  PLT 202 217    Cardiac Enzymes: No results for input(s): CKTOTAL,  CKMB, CKMBINDEX, TROPONINI in the last 168 hours.  BNP (last 3 results) No results for input(s): BNP in the last 8760 hours.  ProBNP (last 3 results) No results for input(s): PROBNP in the last 8760 hours.  Radiological Exams: No results found.  Assessment/Plan Active Problems:   Acute on chronic respiratory failure with hypoxia (HCC)   Empyema lung (HCC)   Chronic obstructive asthma (HCC)   Obstructive sleep apnea   Severe sepsis (HCC)   1. Acute on chronic respiratory failure with hypoxia plan is to continue with T collar wean as tolerated we will continue secretion management supportive care patient should progress today to capping hopefully 2. Empyema of the lung we will continue with supportive care 3. Chronic obstructive asthma controlled nebulizers as necessary 4. Severe sepsis treated resolved   I have personally seen and evaluated the patient, evaluated laboratory and imaging results, formulated the assessment and plan and placed orders. The Patient requires high complexity decision making with multiple systems involvement.  Rounds were done with the Respiratory Therapy Director and Staff therapists and discussed with nursing staff also.  Yevonne Pax, MD [facility] Pulmonary Critical Care Medicine Sleep Medicine

## 2020-06-29 DIAGNOSIS — J869 Pyothorax without fistula: Secondary | ICD-10-CM | POA: Diagnosis not present

## 2020-06-29 DIAGNOSIS — J9621 Acute and chronic respiratory failure with hypoxia: Secondary | ICD-10-CM | POA: Diagnosis not present

## 2020-06-29 DIAGNOSIS — G4733 Obstructive sleep apnea (adult) (pediatric): Secondary | ICD-10-CM | POA: Diagnosis not present

## 2020-06-29 DIAGNOSIS — J449 Chronic obstructive pulmonary disease, unspecified: Secondary | ICD-10-CM | POA: Diagnosis not present

## 2020-06-29 NOTE — Progress Notes (Signed)
Pulmonary Critical Care Medicine [facility] [place]   PULMONARY CRITICAL CARE SERVICE  PROGRESS NOTE  Date of Service: 06/29/2020  Michael Proctor  [id number]  DOB: [birth year]   DOA: 06/24/2020  Referring Physician: Carron Curie, MD  HPI: Michael Proctor is a 60 y.o. male seen for follow up of Acute on Chronic Respiratory Failure.  Patient is on capping trials with a goal of 24 hours  Medications: Reviewed on Rounds  Physical Exam:  Vitals: Temperature is 98.1 pulse 85 respiratory 26 blood pressure is 115/64 saturations 95%  Ventilator Settings capping on 2 L of oxygen  . General: Comfortable at this time . Eyes: Grossly normal lids, irises & conjunctiva . ENT: grossly tongue is normal . Neck: no obvious mass . Cardiovascular: S1 S2 normal no gallop . Respiratory: No rhonchi no rales are noted at this time . Abdomen: soft . Skin: no rash seen on limited exam . Musculoskeletal: not rigid . Psychiatric:unable to assess . Neurologic: no seizure no involuntary movements         Lab Data:   Basic Metabolic Panel: Recent Labs  Lab 06/25/20 0357 06/26/20 0505 06/28/20 0414  NA 136  --  138  K 3.4* 3.8 4.1  CL 93*  --  93*  CO2 33*  --  33*  GLUCOSE 116*  --  109*  BUN 10  --  10  CREATININE 1.05  --  0.81  CALCIUM 8.7*  --  9.0  MG 1.9  --  2.0  PHOS 3.7  --  4.5    ABG: Recent Labs  Lab 06/27/20 1015  PHART 7.415  PCO2ART 55.0*  PO2ART 85.8  HCO3 34.6*  O2SAT 96.0    Liver Function Tests: Recent Labs  Lab 06/25/20 0357  AST 59*  ALT 82*  ALKPHOS 62  BILITOT 0.8  PROT 6.1*  ALBUMIN 2.4*   No results for input(s): LIPASE, AMYLASE in the last 168 hours. No results for input(s): AMMONIA in the last 168 hours.  CBC: Recent Labs  Lab 06/25/20 0357 06/28/20 0414  WBC 6.2 5.8  NEUTROABS 3.8  --   HGB 8.5* 9.3*  HCT 25.5* 28.6*  MCV 93.8 95.7  PLT 202 217    Cardiac Enzymes: No results for input(s): CKTOTAL, CKMB,  CKMBINDEX, TROPONINI in the last 168 hours.  BNP (last 3 results) No results for input(s): BNP in the last 8760 hours.  ProBNP (last 3 results) No results for input(s): PROBNP in the last 8760 hours.  Radiological Exams: No results found.  Assessment/Plan Active Problems:   Acute on chronic respiratory failure with hypoxia (HCC)   Empyema lung (HCC)   Chronic obstructive asthma (HCC)   Obstructive sleep apnea   Severe sepsis (HCC)   1. Acute on chronic respiratory failure hypoxia we will continue with capping today should be completing 24 hours 2. Empyema of the lung we will continue with supportive care 3. Chronic obstructive asthma at baseline 4. Obstructive sleep apnea we will transition to BiPAP 5. Severe sepsis resolved   I have personally seen and evaluated the patient, evaluated laboratory and imaging results, formulated the assessment and plan and placed orders. The Patient requires high complexity decision making with multiple systems involvement.  Rounds were done with the Respiratory Therapy Director and Staff therapists and discussed with nursing staff also.  Yevonne Pax, MD [facility] Pulmonary Critical Care Medicine Sleep Medicine

## 2020-06-30 ENCOUNTER — Encounter: Payer: Self-pay | Admitting: Internal Medicine

## 2020-06-30 DIAGNOSIS — J449 Chronic obstructive pulmonary disease, unspecified: Secondary | ICD-10-CM | POA: Diagnosis not present

## 2020-06-30 DIAGNOSIS — G4733 Obstructive sleep apnea (adult) (pediatric): Secondary | ICD-10-CM | POA: Diagnosis not present

## 2020-06-30 DIAGNOSIS — J9621 Acute and chronic respiratory failure with hypoxia: Secondary | ICD-10-CM | POA: Diagnosis not present

## 2020-06-30 DIAGNOSIS — J869 Pyothorax without fistula: Secondary | ICD-10-CM | POA: Diagnosis present

## 2020-06-30 DIAGNOSIS — A419 Sepsis, unspecified organism: Secondary | ICD-10-CM | POA: Diagnosis present

## 2020-06-30 LAB — RENAL FUNCTION PANEL
Albumin: 2.5 g/dL — ABNORMAL LOW (ref 3.5–5.0)
Anion gap: 8 (ref 5–15)
BUN: 8 mg/dL (ref 6–20)
CO2: 34 mmol/L — ABNORMAL HIGH (ref 22–32)
Calcium: 8.8 mg/dL — ABNORMAL LOW (ref 8.9–10.3)
Chloride: 95 mmol/L — ABNORMAL LOW (ref 98–111)
Creatinine, Ser: 0.79 mg/dL (ref 0.61–1.24)
GFR, Estimated: >60 mL/min (ref >60)
Glucose, Bld: 108 mg/dL — ABNORMAL HIGH (ref 70–99)
Phosphorus: 4.2 mg/dL (ref 2.5–4.6)
Potassium: 4.4 mmol/L (ref 3.5–5.1)
Sodium: 137 mmol/L (ref 135–145)

## 2020-06-30 LAB — CBC
HCT: 29.5 % — ABNORMAL LOW (ref 39.0–52.0)
Hemoglobin: 9.2 g/dL — ABNORMAL LOW (ref 13.0–17.0)
MCH: 30.3 pg (ref 26.0–34.0)
MCHC: 31.2 g/dL (ref 30.0–36.0)
MCV: 97 fL (ref 80.0–100.0)
Platelets: 202 10*3/uL (ref 150–400)
RBC: 3.04 MIL/uL — ABNORMAL LOW (ref 4.22–5.81)
RDW: 15.4 % (ref 11.5–15.5)
WBC: 6.3 10*3/uL (ref 4.0–10.5)
nRBC: 0 % (ref 0.0–0.2)

## 2020-06-30 LAB — MAGNESIUM: Magnesium: 1.9 mg/dL (ref 1.7–2.4)

## 2020-06-30 NOTE — Progress Notes (Signed)
Pulmonary Critical Care Medicine [facility] [place]   PULMONARY CRITICAL CARE SERVICE  PROGRESS NOTE  Date of Service: 06/30/2020  Michael Proctor  TGG:[id number]  DOB: 1960-11-20   DOA: 06/24/2020  Referring Physician: Carron Curie, MD  HPI: Michael Proctor is a 60 y.o. male seen for follow up of Acute on Chronic Respiratory Failure.  Patient is doing well with capping ready for decannulation  Medications: Reviewed on Rounds  Physical Exam:  Vitals: Temperature is 98.3 pulse 97 respiratory 24 blood pressure is 122/64 saturations 96%  Ventilator Settings capping off the ventilator  . General: Comfortable at this time . Eyes: Grossly normal lids, irises & conjunctiva . ENT: grossly tongue is normal . Neck: no obvious mass . Cardiovascular: S1 S2 normal no gallop . Respiratory: No rhonchi very coarse breath sound . Abdomen: soft . Skin: no rash seen on limited exam . Musculoskeletal: not rigid . Psychiatric:unable to assess . Neurologic: no seizure no involuntary movements         Lab Data:   Basic Metabolic Panel: Recent Labs  Lab 06/25/20 0357 06/26/20 0505 06/28/20 0414 06/30/20 0412  NA 136  --  138 137  K 3.4* 3.8 4.1 4.4  CL 93*  --  93* 95*  CO2 33*  --  33* 34*  GLUCOSE 116*  --  109* 108*  BUN 10  --  10 8  CREATININE 1.05  --  0.81 0.79  CALCIUM 8.7*  --  9.0 8.8*  MG 1.9  --  2.0 1.9  PHOS 3.7  --  4.5 4.2    ABG: Recent Labs  Lab 06/27/20 1015  PHART 7.415  PCO2ART 55.0*  PO2ART 85.8  HCO3 34.6*  O2SAT 96.0    Liver Function Tests: Recent Labs  Lab 06/25/20 0357 06/30/20 0412  AST 59*  --   ALT 82*  --   ALKPHOS 62  --   BILITOT 0.8  --   PROT 6.1*  --   ALBUMIN 2.4* 2.5*   No results for input(s): LIPASE, AMYLASE in the last 168 hours. No results for input(s): AMMONIA in the last 168 hours.  CBC: Recent Labs  Lab 06/25/20 0357 06/28/20 0414 06/30/20 0412  WBC 6.2 5.8 6.3  NEUTROABS 3.8  --   --   HGB 8.5*  9.3* 9.2*  HCT 25.5* 28.6* 29.5*  MCV 93.8 95.7 97.0  PLT 202 217 202    Cardiac Enzymes: No results for input(s): CKTOTAL, CKMB, CKMBINDEX, TROPONINI in the last 168 hours.  BNP (last 3 results) No results for input(s): BNP in the last 8760 hours.  ProBNP (last 3 results) No results for input(s): PROBNP in the last 8760 hours.  Radiological Exams: No results found.  Assessment/Plan Active Problems:   Acute on chronic respiratory failure with hypoxia (HCC)   Empyema lung (HCC)   Chronic obstructive asthma (HCC)   Obstructive sleep apnea   Severe sepsis (HCC)   1. Acute on chronic respiratory failure hypoxia we will proceed to decannulation use BiPAP at nighttime for sleep apnea 2. Empyema resolved 3. Chronic asthma under control 4. Obstructive sleep apnea will be using BiPAP at nighttime 5. Severe sepsis treated   I have personally seen and evaluated the patient, evaluated laboratory and imaging results, formulated the assessment and plan and placed orders. The Patient requires high complexity decision making with multiple systems involvement.  Rounds were done with the Respiratory Therapy Director and Staff therapists and discussed with nursing staff also.  Saadat  Richardson Dopp, MD [facility] Pulmonary Critical Care Medicine Sleep Medicine

## 2020-07-03 LAB — CBC
HCT: 29.6 % — ABNORMAL LOW (ref 39.0–52.0)
Hemoglobin: 9.5 g/dL — ABNORMAL LOW (ref 13.0–17.0)
MCH: 31.1 pg (ref 26.0–34.0)
MCHC: 32.1 g/dL (ref 30.0–36.0)
MCV: 97 fL (ref 80.0–100.0)
Platelets: 262 10*3/uL (ref 150–400)
RBC: 3.05 MIL/uL — ABNORMAL LOW (ref 4.22–5.81)
RDW: 15.5 % (ref 11.5–15.5)
WBC: 7.2 10*3/uL (ref 4.0–10.5)
nRBC: 0 % (ref 0.0–0.2)

## 2020-07-03 LAB — BASIC METABOLIC PANEL
Anion gap: 8 (ref 5–15)
BUN: 6 mg/dL (ref 6–20)
CO2: 36 mmol/L — ABNORMAL HIGH (ref 22–32)
Calcium: 9.1 mg/dL (ref 8.9–10.3)
Chloride: 92 mmol/L — ABNORMAL LOW (ref 98–111)
Creatinine, Ser: 0.78 mg/dL (ref 0.61–1.24)
GFR, Estimated: >60 mL/min (ref >60)
Glucose, Bld: 112 mg/dL — ABNORMAL HIGH (ref 70–99)
Potassium: 3.9 mmol/L (ref 3.5–5.1)
Sodium: 136 mmol/L (ref 135–145)

## 2020-07-03 LAB — MAGNESIUM: Magnesium: 1.8 mg/dL (ref 1.7–2.4)

## 2020-07-03 LAB — PHOSPHORUS: Phosphorus: 4 mg/dL (ref 2.5–4.6)

## 2020-07-05 LAB — SARS CORONAVIRUS 2 (TAT 6-24 HRS): SARS Coronavirus 2: NEGATIVE

## 2021-11-23 IMAGING — DX DG ABDOMEN 1V
1 series · 1 of 1 positions shown · non-contrast
Comparison: June 05, 2020.

CLINICAL DATA: Gastrostomy tube placement.

EXAM:
ABDOMEN - 1 VIEW

[abdomen supine]
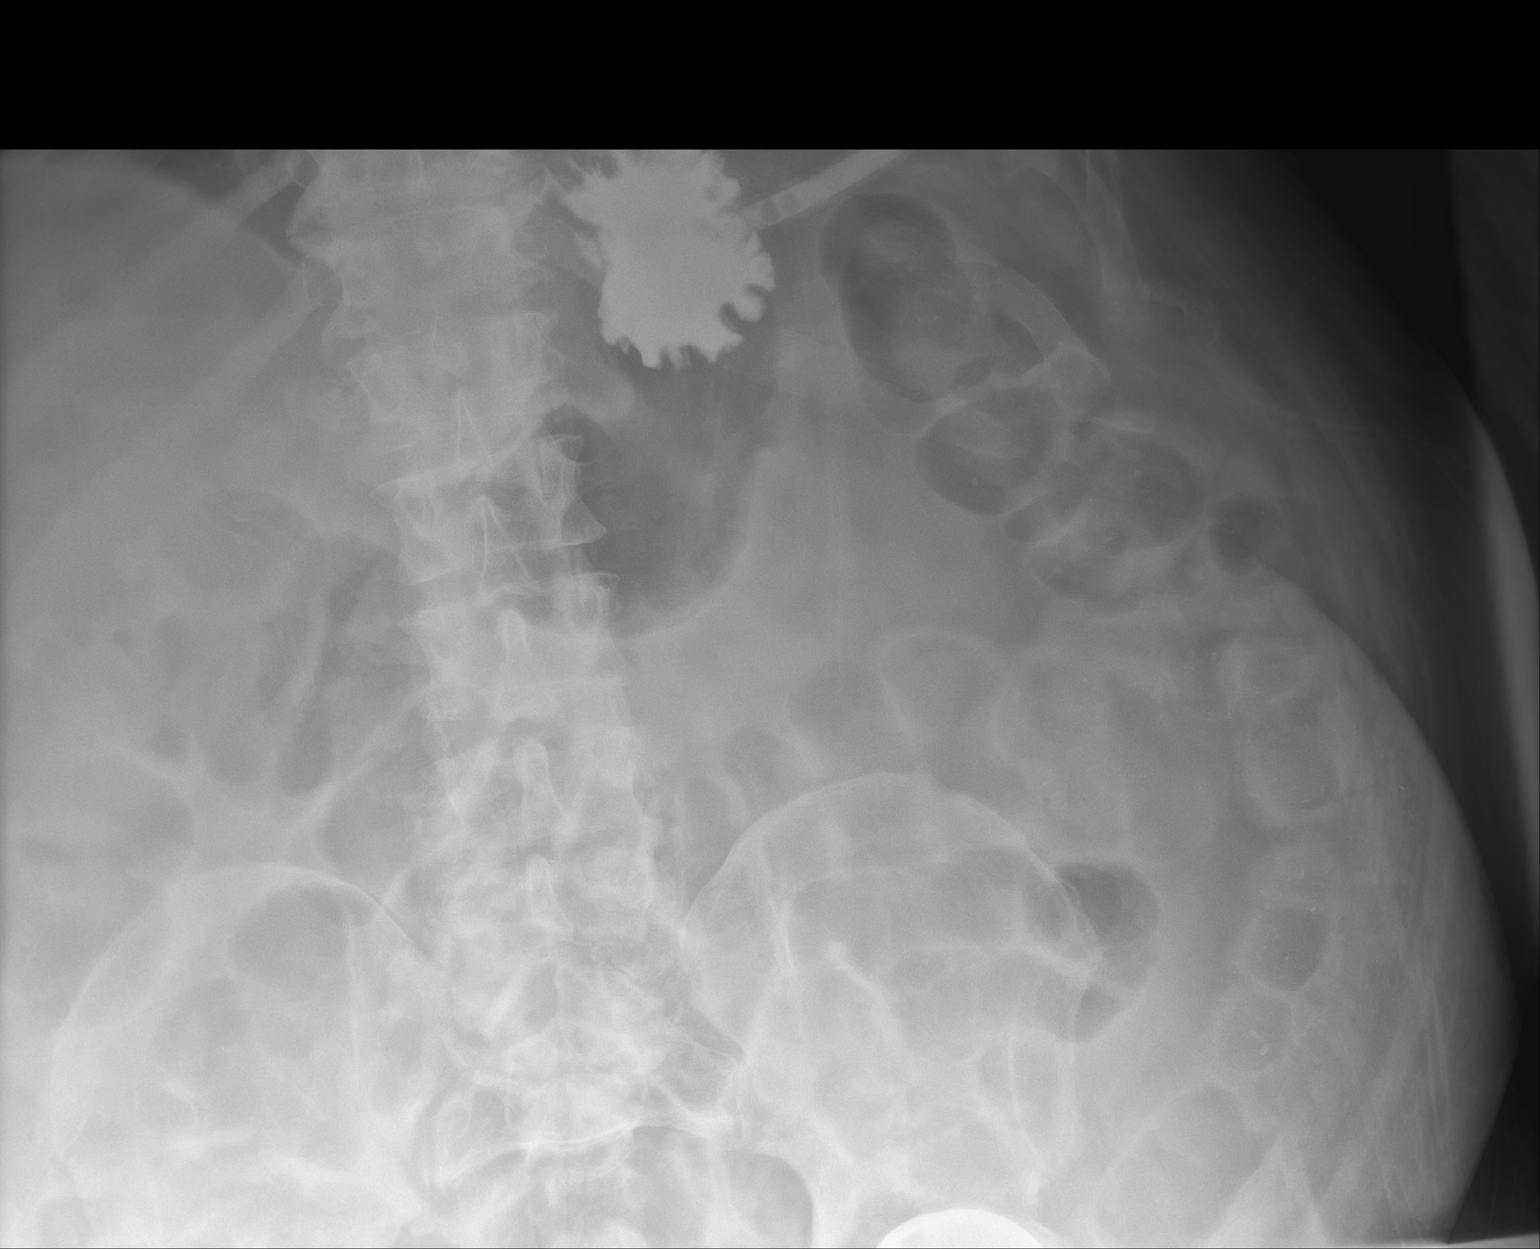

[1 of 1 positions shown; findings below may reference images not displayed]

FINDINGS: Contrast is injected through gastrostomy tube. Normal filling of
contrast into proximal stomach is noted. No extravasation or leakage
is noted. No abnormal bowel dilatation is noted.
IMPRESSION: Gastrostomy tube appears to be in grossly good position. No
extravasation or leakage is noted.

## 2021-11-24 IMAGING — DX DG CHEST 1V PORT
1 series · 1 of 1 positions shown · non-contrast
Comparison: Chest x-ray 06/22/2020.

CLINICAL DATA: 59-year-old male with. Respiratory failure history
of shortness of breath.

EXAM:
PORTABLE CHEST 1 VIEW

[chest ap]
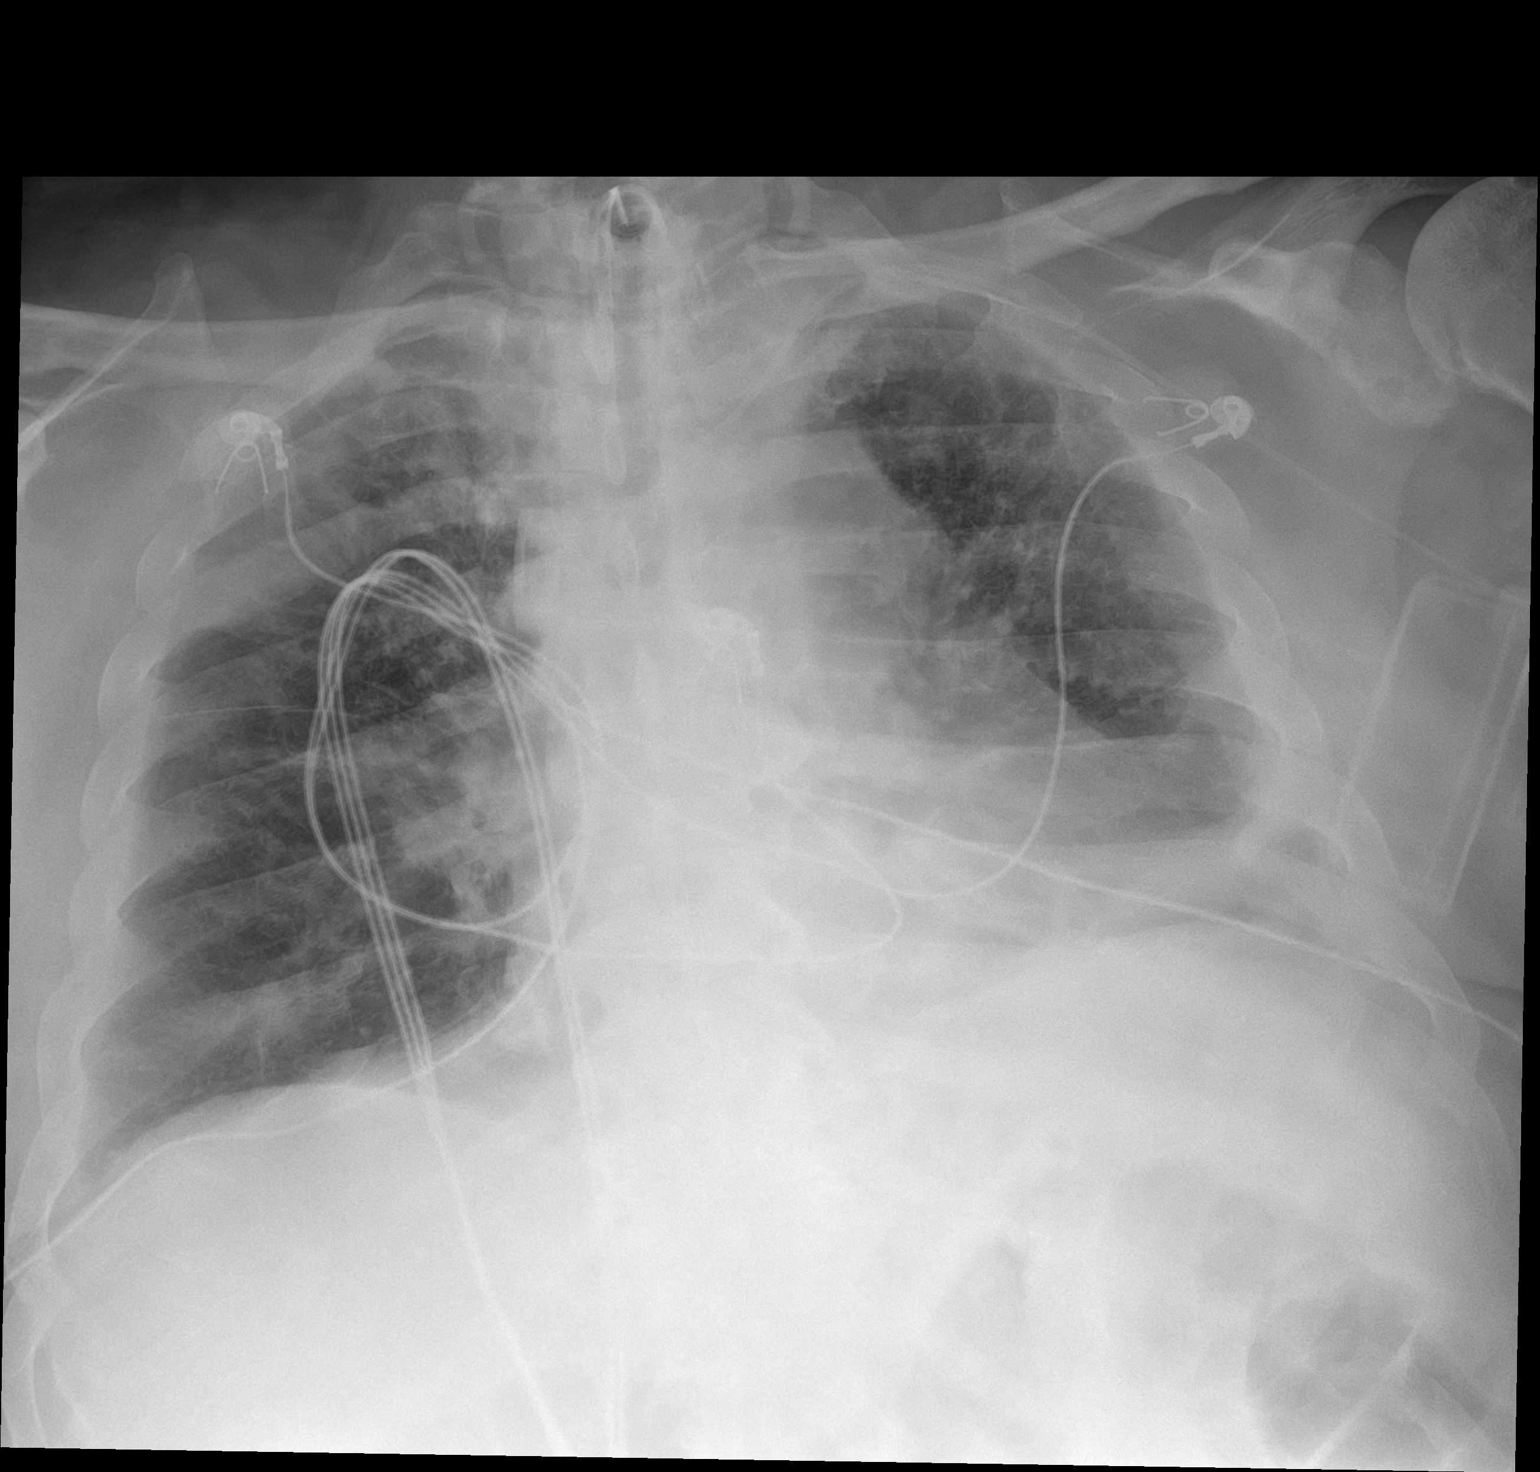

[1 of 1 positions shown; findings below may reference images not displayed]

FINDINGS: There is a left upper extremity PICC with tip terminating in the
proximal superior vena cava. A tracheostomy tube is in place with
tip 5.3 cm above the carina. Lung volumes are low. Patchy
ill-defined opacities and scattered areas of interstitial prominence
are noted throughout the lungs bilaterally, overall significantly
improved compared to the prior examination. Possible trace left
pleural effusion. No right pleural effusion. No pneumothorax.
Appears mildly engorged, without frank pulmonary edema. Heart size
is borderline enlarged. The patient is rotated to the left on
today's exam, resulting in distortion of the mediastinal contours
and reduced diagnostic sensitivity and specificity for mediastinal
pathology.
IMPRESSION: 1. Support apparatus, as above.
2. Improving aeration in the lungs bilaterally, favored to reflect
resolving multilobar bilateral pneumonia.
3. Possible trace left pleural effusion.
